# Patient Record
Sex: Male | Born: 1950 | Race: White | Hispanic: No | Marital: Single | Smoking: Never smoker
Health system: Southern US, Community
[De-identification: ages and names within clinical notes are randomized; demographics above are authoritative.]

## PROBLEM LIST (undated history)

## (undated) DIAGNOSIS — I2510 Atherosclerotic heart disease of native coronary artery without angina pectoris: Secondary | ICD-10-CM

## (undated) DIAGNOSIS — E119 Type 2 diabetes mellitus without complications: Secondary | ICD-10-CM

## (undated) DIAGNOSIS — J449 Chronic obstructive pulmonary disease, unspecified: Secondary | ICD-10-CM

## (undated) DIAGNOSIS — I4891 Unspecified atrial fibrillation: Secondary | ICD-10-CM

## (undated) HISTORY — PX: CORONARY ARTERY BYPASS GRAFT: SHX141

## (undated) HISTORY — PX: CHOLECYSTECTOMY: SHX55

---

## 2001-11-15 ENCOUNTER — Emergency Department (HOSPITAL_COMMUNITY): Admission: EM | Admit: 2001-11-15 | Discharge: 2001-11-15 | Payer: Self-pay | Admitting: *Deleted

## 2001-11-15 ENCOUNTER — Encounter: Payer: Self-pay | Admitting: *Deleted

## 2002-03-16 ENCOUNTER — Emergency Department (HOSPITAL_COMMUNITY): Admission: EM | Admit: 2002-03-16 | Discharge: 2002-03-16 | Payer: Self-pay | Admitting: Emergency Medicine

## 2002-03-16 ENCOUNTER — Encounter: Payer: Self-pay | Admitting: Emergency Medicine

## 2015-08-27 ENCOUNTER — Emergency Department (HOSPITAL_COMMUNITY)
Admission: EM | Admit: 2015-08-27 | Discharge: 2015-08-27 | Disposition: A | Discharge status: Home / Self Care | Payer: Medicare Other | Attending: Emergency Medicine | Admitting: Emergency Medicine

## 2015-08-27 ENCOUNTER — Encounter (HOSPITAL_COMMUNITY): Payer: Self-pay | Admitting: *Deleted

## 2015-08-27 ENCOUNTER — Emergency Department (HOSPITAL_COMMUNITY): Payer: Medicare Other

## 2015-08-27 DIAGNOSIS — R0789 Other chest pain: Secondary | ICD-10-CM | POA: Diagnosis not present

## 2015-08-27 DIAGNOSIS — M545 Low back pain: Secondary | ICD-10-CM | POA: Diagnosis present

## 2015-08-27 HISTORY — DX: Type 2 diabetes mellitus without complications: E11.9

## 2015-08-27 HISTORY — DX: Atherosclerotic heart disease of native coronary artery without angina pectoris: I25.10

## 2015-08-27 HISTORY — DX: Chronic obstructive pulmonary disease, unspecified: J44.9

## 2015-08-27 HISTORY — DX: Unspecified atrial fibrillation: I48.91

## 2015-08-27 MED ORDER — OXYCODONE-ACETAMINOPHEN 5-325 MG PO TABS
1.0000 | ORAL_TABLET | Freq: Once | ORAL | Status: AC
  Administered 2015-08-27: 1 via ORAL
  Filled 2015-08-27: qty 1

## 2015-08-27 MED ORDER — OXYCODONE-ACETAMINOPHEN 5-325 MG PO TABS: 1.0000 | ORAL_TABLET | ORAL | Status: DC | PRN

## 2015-08-27 NOTE — ED Notes (Signed)
Pt reporting pain in right lower back.  Reporting pain began yesterday morning.  States that he did have a CT scan about a month ago.  States that there were no stones seen at that time.

## 2015-08-27 NOTE — ED Notes (Signed)
Patient transported to X-ray 

## 2015-08-27 NOTE — ED Notes (Signed)
Pt reports mid to lower right sided back pain that radiates around to the posterior right side of abdomen that started yesterday morning. Pt denies fall or injury. Pt denies any urinary problems. Pt reports having CT scan about a month ago due to trouble with urination which came back negative for kidney stones. Pt was not having any back pain at the time of CT scan. Pt has extreme pain when trying to move even the slightest bit. Pt very tender upon palpation of the area.

## 2015-08-27 NOTE — Discharge Instructions (Signed)
Chest Wall Pain Chest wall pain is pain in or around the bones and muscles of your chest. It may take up to 6 weeks to get better. It may take longer if you must stay physically active in your work and activities.  CAUSES  Chest wall pain may happen on its own. However, it may be caused by:  A viral illness like the flu.  Injury.  Coughing.  Exercise.  Arthritis.  Fibromyalgia.  Shingles. HOME CARE INSTRUCTIONS   Avoid overtiring physical activity. Try not to strain or perform activities that cause pain. This includes any activities using your chest or your abdominal and side muscles, especially if heavy weights are used.  Put ice on the sore area.  Put ice in a plastic bag.  Place a towel between your skin and the bag.  Leave the ice on for 15-20 minutes per hour while awake for the first 2 days.  Only take over-the-counter or prescription medicines for pain, discomfort, or fever as directed by your caregiver. SEEK IMMEDIATE MEDICAL CARE IF:   Your pain increases, or you are very uncomfortable.  You have a fever.  Your chest pain becomes worse.  You have new, unexplained symptoms.  You have nausea or vomiting.  You feel sweaty or lightheaded.  You have a cough with phlegm (sputum), or you cough up blood. MAKE SURE YOU:   Understand these instructions.  Will watch your condition.  Will get help right away if you are not doing well or get worse. Document Released: 12/04/2005 Document Revised: 02/26/2012 Document Reviewed: 07/31/2011 [facility] Patient Information 2015 [place], [place]. This information is not intended to replace advice given to you by your health care provider. Make sure you discuss any questions you have with your health care provider.  Acetaminophen; Oxycodone tablets What is this medicine? ACETAMINOPHEN; OXYCODONE (a set a MEE noe fen; ox i KOE done) is a pain reliever. It is used to treat mild to moderate pain. This medicine may be used for  other purposes; ask your health care provider or pharmacist if you have questions. COMMON BRAND NAME(S): Endocet, Magnacet, Narvox, Percocet, Perloxx, Primalev, Primlev, Roxicet, Xolox What should I tell my health care provider before I take this medicine? They need to know if you have any of these conditions: -brain tumor -Crohn's disease, inflammatory bowel disease, or ulcerative colitis -drug abuse or addiction -head injury -heart or circulation problems -if you often drink alcohol -kidney disease or problems going to the bathroom -liver disease -lung disease, asthma, or breathing problems -an unusual or allergic reaction to acetaminophen, oxycodone, other opioid analgesics, other medicines, foods, dyes, or preservatives -pregnant or trying to get pregnant -breast-feeding How should I use this medicine? Take this medicine by mouth with a full glass of water. Follow the directions on the prescription label. Take your medicine at regular intervals. Do not take your medicine more often than directed. Talk to your pediatrician regarding the use of this medicine in children. Special care may be needed. Patients over 61 years old may have a stronger reaction and need a smaller dose. Overdosage: If you think you have taken too much of this medicine contact a poison control center or emergency room at once. NOTE: This medicine is only for you. Do not share this medicine with others. What if I miss a dose? If you miss a dose, take it as soon as you can. If it is almost time for your next dose, take only that dose. Do not take double  or extra doses. What may interact with this medicine? -alcohol -antihistamines -barbiturates like amobarbital, butalbital, butabarbital, methohexital, pentobarbital, phenobarbital, thiopental, and secobarbital -benztropine -drugs for bladder problems like solifenacin, trospium, oxybutynin, tolterodine, hyoscyamine, and methscopolamine -drugs for breathing problems  like ipratropium and tiotropium -drugs for certain stomach or intestine problems like propantheline, homatropine methylbromide, glycopyrrolate, atropine, belladonna, and dicyclomine -general anesthetics like etomidate, ketamine, nitrous oxide, propofol, desflurane, enflurane, halothane, isoflurane, and sevoflurane -medicines for depression, anxiety, or psychotic disturbances -medicines for sleep -muscle relaxants -naltrexone -narcotic medicines (opiates) for pain -phenothiazines like perphenazine, thioridazine, chlorpromazine, mesoridazine, fluphenazine, prochlorperazine, promazine, and trifluoperazine -scopolamine -tramadol -trihexyphenidyl This list may not describe all possible interactions. Give your health care provider a list of all the medicines, herbs, non-prescription drugs, or dietary supplements you use. Also tell them if you smoke, drink alcohol, or use illegal drugs. Some items may interact with your medicine. What should I watch for while using this medicine? Tell your doctor or health care professional if your pain does not go away, if it gets worse, or if you have new or a different type of pain. You may develop tolerance to the medicine. Tolerance means that you will need a higher dose of the medication for pain relief. Tolerance is normal and is expected if you take this medicine for a long time. Do not suddenly stop taking your medicine because you may develop a severe reaction. Your body becomes used to the medicine. This does NOT mean you are addicted. Addiction is a behavior related to getting and using a drug for a non-medical reason. If you have pain, you have a medical reason to take pain medicine. Your doctor will tell you how much medicine to take. If your doctor wants you to stop the medicine, the dose will be slowly lowered over time to avoid any side effects. You may get drowsy or dizzy. Do not drive, use machinery, or do anything that needs mental alertness until you  know how this medicine affects you. Do not stand or sit up quickly, especially if you are an older patient. This reduces the risk of dizzy or fainting spells. Alcohol may interfere with the effect of this medicine. Avoid alcoholic drinks. There are different types of narcotic medicines (opiates) for pain. If you take more than one type at the same time, you may have more side effects. Give your health care provider a list of all medicines you use. Your doctor will tell you how much medicine to take. Do not take more medicine than directed. Call emergency for help if you have problems breathing. The medicine will cause constipation. Try to have a bowel movement at least every 2 to 3 days. If you do not have a bowel movement for 3 days, call your doctor or health care professional. Do not take Tylenol (acetaminophen) or medicines that have acetaminophen with this medicine. Too much acetaminophen can be very dangerous. Many nonprescription medicines contain acetaminophen. Always read the labels carefully to avoid taking more acetaminophen. What side effects may I notice from receiving this medicine? Side effects that you should report to your doctor or health care professional as soon as possible: -allergic reactions like skin rash, itching or hives, swelling of the face, lips, or tongue -breathing difficulties, wheezing -confusion -light headedness or fainting spells -severe stomach pain -unusually weak or tired -yellowing of the skin or the whites of the eyes Side effects that usually do not require medical attention (report to your doctor or health care professional if they  continue or are bothersome): -dizziness -drowsiness -nausea -vomiting This list may not describe all possible side effects. Call your doctor for medical advice about side effects. You may report side effects to FDA at 1-800-FDA-1088. Where should I keep my medicine? Keep out of the reach of children. This medicine can be  abused. Keep your medicine in a safe place to protect it from theft. Do not share this medicine with anyone. Selling or giving away this medicine is dangerous and against the law. Store at room temperature between 20 and 25 degrees C (68 and 77 degrees F). Keep container tightly closed. Protect from light. This medicine may cause accidental overdose and death if it is taken by other adults, children, or pets. Flush any unused medicine down the toilet to reduce the chance of harm. Do not use the medicine after the expiration date. NOTE: This sheet is a summary. It may not cover all possible information. If you have questions about this medicine, talk to your doctor, pharmacist, or health care provider.  2015, [employer] Standard. (2013-07-28 13:17:35)  Adenosine Stress Electrocardiography An adenosine stress electrocardiography is a test used to detect heart disease (coronary artery disease). Adenosine is a medicine that makes the heart arteries react as if you are exercising. Adenosine is given with a radioactive tracer. The "tracer" is a safe radioactive substance that travels in the bloodstream to the heart arteries. Special imaging cameras detect the tracer and help find blocked arteries in the heart. This test may be done with or without treadmill exercise.  LET [facility] CARE PROVIDER KNOW ABOUT:  Allergies, including latex allergies.  All prescription medicines you taking as well as all non-prescription and over-the-counter medicines, including herbs and vitamins.  Use of steroids (by mouth or creams).  Previous problems with anesthetics or novocaine.  History of blood clots or bleeding problems.  Previous surgery.  Other health problems such as kidney or lung conditions.  Possibility of pregnancy, if this applies. RISKS AND COMPLICATIONS You may develop chest discomfort, shortness of breath, sweating, or light-headedness during the test. On rare occasions, you could experience  a heart attack or your heart may go into a very fast or irregular rhythm. This could cause you to collapse. To ensure your safety, your health care provider will supervise the test. Your blood pressure and electrocardiogram are constantly watched. The test team watches for and is able to treat any problems. BEFORE THE PROCEDURE  Do not eat or drink caffeine for 12 to 24 hours before the test. This includes all caffeinated beverages and food, such as pop, coffee (roasted, instant, decaffeinated roasted, decaffeinated instant), hot chocolate, tea, and all chocolate.  Do not smoke on the day of your test. Smoking on the day of your test may change your test results.  Do not eat anything 3 hours before the test or as recommended by your health care provider. Eating may cause an unclear image and may also cause nausea. If you are diabetic, talk to your health care provider regarding your insulin coverage.  Bring a list of all the medicines you are taking. Take your medicine as usual before the test except as told by the testing center.  Wear comfortable clothing, such as a short-sleeve shirt and sweatpants. Do not wear an underwire bra or jewelry. A hospital gown can be provided.  Shower before your appointment to reduce the spread of bacteria.  You may want to bring a book to read because there are some waiting periods during the  test.  Your health care provider will go over the adenosine stress test with you, such as procedure protocol, what to expect, how long it will take, and results. PROCEDURE   An IV will be started in a vein in your hand or arm.  Electrode patches will be placed on your chest. The electrodes are connected to a monitor so your heart rhythm and heart rate can be watched. Your blood pressure will also be monitored during the test.  Two sets of images are usually taken of your heart. The images compare your heart at rest and when it is "stressed." This first image is a  "resting" picture of your heart. The "resting" image is usually done before adenosine is given.  Adenosine is given in the IV over a period of 4 to 6 minutes.  After the adenosine is given, you will be monitored for a few minutes afterward to ensure your heart rate, heart rhythm, and blood pressure are normal. AFTER THE PROCEDURE  When your test is completed, you may be asked to schedule an office visit with your health care provider to discuss the test results, or your health care provider may choose to call you with the results.  Document Released: 02/11/2007 Document Revised: 04/20/2014 Document Reviewed: 03/20/2012 [facility] Patient Information 2015 [place], [place]. This information is not intended to replace advice given to you by your health care provider. Make sure you discuss any questions you have with your health care provider.

## 2015-08-27 NOTE — ED Provider Notes (Signed)
CSN: [id number]     Arrival date & time 08/27/15  0615 History   First MD Initiated Contact with Patient 08/27/15 [phone]     Chief Complaint  Patient presents with  . Back Pain     (Consider location/radiation/quality/duration/timing/severity/associated sxs/prior Treatment) Patient is a 64 y.o. male presenting with back pain. The history is provided by the patient.  Back Pain He has been having pain in his right lower back since yesterday. Pain is sharp and worse with movement. It feels better if he lays still. He denies any weakness or numbness or tingling and denies any bowel or bladder dysfunction. He denies any recent trauma or unusual activity. He took acetaminophen for pain without relief. He denies previous back problems.  No past medical history on file. No past surgical history on file. No family history on file. Social History  Substance Use Topics  . Smoking status: Not on file  . Smokeless tobacco: Not on file  . Alcohol Use: Not on file    Review of Systems  Musculoskeletal: Positive for back pain.  All other systems reviewed and are negative.     Allergies  Review of patient's allergies indicates not on file.  Home Medications   Prior to Admission medications   Not on File   BP 99/72 mmHg  Pulse 73  Temp(Src) 97.5 F (36.4 C) (Oral)  Resp 16  Ht 6\' 4"  (1.93 m)  Wt 245 lb (111.131 kg)  BMI 29.83 kg/m2  SpO2 95% Physical Exam  Nursing note and vitals reviewed.  64 year old male, resting comfortably and in no acute distress. Vital signs are normal. Oxygen saturation is 95%, which is normal. Head is normocephalic and atraumatic. PERRLA, EOMI. Oropharynx is clear. Neck is nontender and supple without adenopathy or JVD. Lungs are clear without rales, wheezes, or rhonchi. Heart has regular rate and rhythm without murmur. Chest has point tenderness in the right lower posterior rib cage. Back is nontender and there is no CVA tenderness. Tenderness is only  over the ribs. Abdomen is soft, flat, nontender without masses or hepatosplenomegaly and peristalsis is normoactive. Extremities have no cyanosis or edema, full range of motion is present. Skin is warm and dry without rash. Neurologic: Mental status is normal, cranial nerves are intact, there are no motor or sensory deficits.  ED Course  Procedures (including critical care time)  Imaging Review Dg Ribs Unilateral W/chest Right  08/27/2015   CLINICAL DATA:  Right lower chest and back pain which began 08/26/2015. No known injury. Initial encounter.  EXAM: RIGHT RIBS AND CHEST - 3+ VIEW  COMPARISON:  None.  FINDINGS: Pacing device is in place. There is cardiomegaly and mild pulmonary vascular congestion. No edema, consolidative process, pneumothorax or effusion is identified. No fracture is seen.  IMPRESSION: Negative for fracture or other acute abnormality.  Cardiomegaly and pulmonary vascular congestion.   Electronically Signed   By: Drusilla Kanner M.D.   On: 08/27/2015 07:42   I have personally reviewed and evaluated these images as part of my medical decision-making.   MDM   Final diagnoses:  Chest wall pain    Back pain which seems to be centered over the right ribs. He will be sent for x-rays of the right ribs.  X-rays are unremarkable. He is discharged with prescription for oxycodone have acetaminophen for pain.  Dione Booze, MD 08/27/15 [phone]

## 2015-10-09 ENCOUNTER — Other Ambulatory Visit: Payer: Self-pay

## 2015-10-09 ENCOUNTER — Emergency Department (HOSPITAL_COMMUNITY): Payer: Medicare Other

## 2015-10-09 ENCOUNTER — Inpatient Hospital Stay (HOSPITAL_COMMUNITY)
Admission: EM | Admit: 2015-10-09 | Discharge: 2015-10-12 | DRG: 392 | Disposition: A | Discharge status: Home / Self Care | Payer: Medicare Other | Attending: Internal Medicine | Admitting: Internal Medicine

## 2015-10-09 DIAGNOSIS — E119 Type 2 diabetes mellitus without complications: Secondary | ICD-10-CM

## 2015-10-09 DIAGNOSIS — Z7901 Long term (current) use of anticoagulants: Secondary | ICD-10-CM | POA: Diagnosis not present

## 2015-10-09 DIAGNOSIS — R791 Abnormal coagulation profile: Secondary | ICD-10-CM | POA: Diagnosis present

## 2015-10-09 DIAGNOSIS — I5022 Chronic systolic (congestive) heart failure: Secondary | ICD-10-CM | POA: Diagnosis present

## 2015-10-09 DIAGNOSIS — E860 Dehydration: Secondary | ICD-10-CM | POA: Diagnosis present

## 2015-10-09 DIAGNOSIS — Z6830 Body mass index (BMI) 30.0-30.9, adult: Secondary | ICD-10-CM

## 2015-10-09 DIAGNOSIS — R1030 Lower abdominal pain, unspecified: Secondary | ICD-10-CM

## 2015-10-09 DIAGNOSIS — I2510 Atherosclerotic heart disease of native coronary artery without angina pectoris: Secondary | ICD-10-CM | POA: Diagnosis present

## 2015-10-09 DIAGNOSIS — I482 Chronic atrial fibrillation, unspecified: Secondary | ICD-10-CM | POA: Diagnosis present

## 2015-10-09 DIAGNOSIS — Z950 Presence of cardiac pacemaker: Secondary | ICD-10-CM

## 2015-10-09 DIAGNOSIS — I959 Hypotension, unspecified: Secondary | ICD-10-CM | POA: Diagnosis present

## 2015-10-09 DIAGNOSIS — Z951 Presence of aortocoronary bypass graft: Secondary | ICD-10-CM

## 2015-10-09 DIAGNOSIS — D696 Thrombocytopenia, unspecified: Secondary | ICD-10-CM | POA: Diagnosis present

## 2015-10-09 DIAGNOSIS — K5732 Diverticulitis of large intestine without perforation or abscess without bleeding: Secondary | ICD-10-CM | POA: Diagnosis not present

## 2015-10-09 DIAGNOSIS — E669 Obesity, unspecified: Secondary | ICD-10-CM | POA: Diagnosis present

## 2015-10-09 DIAGNOSIS — D649 Anemia, unspecified: Secondary | ICD-10-CM | POA: Diagnosis present

## 2015-10-09 DIAGNOSIS — J449 Chronic obstructive pulmonary disease, unspecified: Secondary | ICD-10-CM | POA: Diagnosis present

## 2015-10-09 DIAGNOSIS — Z794 Long term (current) use of insulin: Secondary | ICD-10-CM

## 2015-10-09 DIAGNOSIS — R109 Unspecified abdominal pain: Secondary | ICD-10-CM | POA: Diagnosis not present

## 2015-10-09 DIAGNOSIS — N179 Acute kidney failure, unspecified: Secondary | ICD-10-CM | POA: Diagnosis present

## 2015-10-09 DIAGNOSIS — K5792 Diverticulitis of intestine, part unspecified, without perforation or abscess without bleeding: Secondary | ICD-10-CM | POA: Diagnosis present

## 2015-10-09 LAB — CBC WITH DIFFERENTIAL/PLATELET
BASOS PCT: 0 %
Basophils Absolute: 0 10*3/uL (ref 0.0–0.1)
EOS ABS: 0.1 10*3/uL (ref 0.0–0.7)
Eosinophils Relative: 1 %
HCT: 35.9 % — ABNORMAL LOW (ref 39.0–52.0)
HEMOGLOBIN: 12.2 g/dL — AB (ref 13.0–17.0)
Lymphocytes Relative: 14 %
Lymphs Abs: 1.9 10*3/uL (ref 0.7–4.0)
MCH: 32 pg (ref 26.0–34.0)
MCHC: 34 g/dL (ref 30.0–36.0)
MCV: 94.2 fL (ref 78.0–100.0)
Monocytes Absolute: 2.6 10*3/uL — ABNORMAL HIGH (ref 0.1–1.0)
Monocytes Relative: 19 %
NEUTROS PCT: 65 %
Neutro Abs: 8.8 10*3/uL — ABNORMAL HIGH (ref 1.7–7.7)
PLATELETS: 129 10*3/uL — AB (ref 150–400)
RBC: 3.81 MIL/uL — AB (ref 4.22–5.81)
RDW: 16.1 % — ABNORMAL HIGH (ref 11.5–15.5)
WBC: 13.4 10*3/uL — AB (ref 4.0–10.5)

## 2015-10-09 LAB — COMPREHENSIVE METABOLIC PANEL
ALBUMIN: 4.1 g/dL (ref 3.5–5.0)
ALK PHOS: 56 U/L (ref 38–126)
ALT: 14 U/L — AB (ref 17–63)
ANION GAP: 12 (ref 5–15)
AST: 21 U/L (ref 15–41)
BUN: 93 mg/dL — ABNORMAL HIGH (ref 6–20)
CALCIUM: 9 mg/dL (ref 8.9–10.3)
CHLORIDE: 97 mmol/L — AB (ref 101–111)
CO2: 25 mmol/L (ref 22–32)
Creatinine, Ser: 2.68 mg/dL — ABNORMAL HIGH (ref 0.61–1.24)
GFR calc Af Amer: 27 mL/min — ABNORMAL LOW (ref >60)
GFR calc non Af Amer: 24 mL/min — ABNORMAL LOW (ref >60)
GLUCOSE: 139 mg/dL — AB (ref 65–99)
Potassium: 4.2 mmol/L (ref 3.5–5.1)
SODIUM: 134 mmol/L — AB (ref 135–145)
Total Bilirubin: 1.4 mg/dL — ABNORMAL HIGH (ref 0.3–1.2)
Total Protein: 7.8 g/dL (ref 6.5–8.1)

## 2015-10-09 LAB — LACTIC ACID, PLASMA: LACTIC ACID, VENOUS: 1 mmol/L (ref 0.5–2.0)

## 2015-10-09 LAB — URINALYSIS, ROUTINE W REFLEX MICROSCOPIC
BILIRUBIN URINE: NEGATIVE
Glucose, UA: NEGATIVE mg/dL
Hgb urine dipstick: NEGATIVE
Ketones, ur: NEGATIVE mg/dL
Leukocytes, UA: NEGATIVE
NITRITE: NEGATIVE
PH: 5.5 (ref 5.0–8.0)
Protein, ur: NEGATIVE mg/dL
SPECIFIC GRAVITY, URINE: 1.015 (ref 1.005–1.030)
Urobilinogen, UA: 0.2 mg/dL (ref 0.0–1.0)

## 2015-10-09 LAB — GLUCOSE, CAPILLARY: GLUCOSE-CAPILLARY: 163 mg/dL — AB (ref 65–99)

## 2015-10-09 LAB — POC OCCULT BLOOD, ED: FECAL OCCULT BLD: NEGATIVE

## 2015-10-09 LAB — PROTIME-INR
INR: 4.62 — ABNORMAL HIGH (ref 0.00–1.49)
Prothrombin Time: 42.3 seconds — ABNORMAL HIGH (ref 11.6–15.2)

## 2015-10-09 MED ORDER — METRONIDAZOLE IN NACL 5-0.79 MG/ML-% IV SOLN
500.0000 mg | Freq: Three times a day (TID) | INTRAVENOUS | Status: DC
  Administered 2015-10-10 – 2015-10-11 (×6): 500 mg via INTRAVENOUS
  Filled 2015-10-09 (×6): qty 100

## 2015-10-09 MED ORDER — CIPROFLOXACIN HCL 500 MG PO TABS: 500.0000 mg | ORAL_TABLET | Freq: Two times a day (BID) | ORAL | Status: DC

## 2015-10-09 MED ORDER — MEXILETINE HCL 150 MG PO CAPS
150.0000 mg | ORAL_CAPSULE | Freq: Three times a day (TID) | ORAL | Status: DC
  Administered 2015-10-09 – 2015-10-12 (×7): 150 mg via ORAL
  Filled 2015-10-09 (×13): qty 1

## 2015-10-09 MED ORDER — SIMVASTATIN 20 MG PO TABS
20.0000 mg | ORAL_TABLET | Freq: Every day | ORAL | Status: DC
  Administered 2015-10-10 – 2015-10-12 (×3): 20 mg via ORAL
  Filled 2015-10-09 (×3): qty 1

## 2015-10-09 MED ORDER — WARFARIN - PHARMACIST DOSING INPATIENT: Status: DC

## 2015-10-09 MED ORDER — CIPROFLOXACIN IN D5W 400 MG/200ML IV SOLN
400.0000 mg | Freq: Once | INTRAVENOUS | Status: AC
  Administered 2015-10-09: 400 mg via INTRAVENOUS
  Filled 2015-10-09: qty 200

## 2015-10-09 MED ORDER — SODIUM CHLORIDE 0.9 % IV SOLN
INTRAVENOUS | Status: DC
  Administered 2015-10-09: 21:00:00 via INTRAVENOUS

## 2015-10-09 MED ORDER — INSULIN ASPART PROT & ASPART (70-30 MIX) 100 UNIT/ML ~~LOC~~ SUSP
SUBCUTANEOUS | Status: AC
Start: 2015-10-09 — End: 2015-10-09
  Filled 2015-10-09: qty 10

## 2015-10-09 MED ORDER — SODIUM CHLORIDE 0.9 % IV BOLUS (SEPSIS)
500.0000 mL | Freq: Once | INTRAVENOUS | Status: AC
  Administered 2015-10-09: 500 mL via INTRAVENOUS

## 2015-10-09 MED ORDER — METRONIDAZOLE 500 MG PO TABS
500.0000 mg | ORAL_TABLET | Freq: Once | ORAL | Status: AC
  Administered 2015-10-09: 500 mg via ORAL
  Filled 2015-10-09: qty 1

## 2015-10-09 MED ORDER — ONDANSETRON HCL 4 MG/2ML IJ SOLN: 4.0000 mg | Freq: Four times a day (QID) | INTRAMUSCULAR | Status: DC | PRN

## 2015-10-09 MED ORDER — MEXILETINE HCL 150 MG PO CAPS
ORAL_CAPSULE | ORAL | Status: AC
  Filled 2015-10-09: qty 1

## 2015-10-09 MED ORDER — CIPROFLOXACIN IN D5W 400 MG/200ML IV SOLN
400.0000 mg | Freq: Two times a day (BID) | INTRAVENOUS | Status: DC
  Administered 2015-10-10 – 2015-10-12 (×5): 400 mg via INTRAVENOUS
  Filled 2015-10-09 (×5): qty 200

## 2015-10-09 MED ORDER — MORPHINE SULFATE (PF) 4 MG/ML IV SOLN
4.0000 mg | Freq: Once | INTRAVENOUS | Status: DC | PRN
Start: 2015-10-09 — End: 2015-10-09
  Administered 2015-10-09: 4 mg via INTRAVENOUS
  Filled 2015-10-09: qty 1

## 2015-10-09 MED ORDER — ONDANSETRON HCL 4 MG PO TABS: 4.0000 mg | ORAL_TABLET | Freq: Four times a day (QID) | ORAL | Status: DC | PRN

## 2015-10-09 MED ORDER — ACETAMINOPHEN 650 MG RE SUPP: 650.0000 mg | Freq: Four times a day (QID) | RECTAL | Status: DC | PRN

## 2015-10-09 MED ORDER — ONDANSETRON HCL 4 MG/2ML IJ SOLN: 4.0000 mg | Freq: Three times a day (TID) | INTRAMUSCULAR | Status: DC | PRN

## 2015-10-09 MED ORDER — INSULIN ASPART PROT & ASPART (70-30 MIX) 100 UNIT/ML ~~LOC~~ SUSP
20.0000 [IU] | Freq: Two times a day (BID) | SUBCUTANEOUS | Status: DC
  Administered 2015-10-09: 20 [IU] via SUBCUTANEOUS
  Filled 2015-10-09: qty 10

## 2015-10-09 MED ORDER — METRONIDAZOLE 500 MG PO TABS: 500.0000 mg | ORAL_TABLET | Freq: Three times a day (TID) | ORAL | Status: DC

## 2015-10-09 MED ORDER — TIOTROPIUM BROMIDE MONOHYDRATE 18 MCG IN CAPS
18.0000 ug | ORAL_CAPSULE | Freq: Every day | RESPIRATORY_TRACT | Status: DC
  Administered 2015-10-11 – 2015-10-12 (×2): 18 ug via RESPIRATORY_TRACT
  Filled 2015-10-09: qty 5

## 2015-10-09 MED ORDER — HYDROMORPHONE HCL 1 MG/ML IJ SOLN: 0.5000 mg | INTRAMUSCULAR | Status: DC | PRN

## 2015-10-09 MED ORDER — INSULIN LISPRO PROT & LISPRO (75-25 MIX) 100 UNIT/ML KWIKPEN: 20.0000 [IU] | PEN_INJECTOR | Freq: Two times a day (BID) | SUBCUTANEOUS | Status: DC

## 2015-10-09 MED ORDER — ACETAMINOPHEN 325 MG PO TABS: 650.0000 mg | ORAL_TABLET | Freq: Four times a day (QID) | ORAL | Status: DC | PRN

## 2015-10-09 MED ORDER — INSULIN ASPART 100 UNIT/ML ~~LOC~~ SOLN
0.0000 [IU] | Freq: Three times a day (TID) | SUBCUTANEOUS | Status: DC
  Administered 2015-10-10: 1 [IU] via SUBCUTANEOUS
  Administered 2015-10-10 – 2015-10-11 (×2): 3 [IU] via SUBCUTANEOUS
  Administered 2015-10-11 – 2015-10-12 (×3): 2 [IU] via SUBCUTANEOUS

## 2015-10-09 NOTE — ED Notes (Signed)
Patient stood up to ambulate and was very unsteady on his feet. Remained in room with patient. Orthostatic vital signs obtained. Harolyn Rutherford, PA-C and Dr. Hyacinth Meeker aware. Patient status changed to admission.

## 2015-10-09 NOTE — Discharge Instructions (Signed)
You have been diagnosed with diverticulitis today. You are being discharged with two antibiotics. Take both in their entirety even if you start to feel better. These antibiotics can increase your INR when taken with your coumadin. Be sure to follow up with a PCP or this department to have your INR checked in 48 hours.  You also need to be aware that you will need to be more careful, as your bleeding risk can increase. Your CT scan also showed small inguinal hernias on both sides.  You just need to be aware of these and follow up with a PCP on them.    Emergency Department Resource Guide 1) Find a Doctor and Pay Out of Pocket Although you won't have to find out who is covered by your insurance plan, it is a good idea to ask around and get recommendations. You will then need to call the office and see if the doctor you have chosen will accept you as a new patient and what types of options they offer for patients who are self-pay. Some doctors offer discounts or will set up payment plans for their patients who do not have insurance, but you will need to ask so you aren't surprised when you get to your appointment.  2) Contact Your Local Health Department Not all health departments have doctors that can see patients for sick visits, but many do, so it is worth a call to see if yours does. If you don't know where your local health department is, you can check in your phone book. The CDC also has a tool to help you locate your state's health department, and many state websites also have listings of all of their local health departments.  3) Find a Walk-in Clinic If your illness is not likely to be very severe or complicated, you may want to try a walk in clinic. These are popping up all over the country in pharmacies, drugstores, and shopping centers. They're usually staffed by nurse practitioners or physician assistants that have been trained to treat common illnesses and complaints. They're usually fairly  quick and inexpensive. However, if you have serious medical issues or chronic medical problems, these are probably not your best option.  No Primary Care Doctor: - Call Health Connect at  [phone] - they can help you locate a primary care doctor that  accepts your insurance, provides certain services, etc. - Physician Referral Service- [phone]  Chronic Pain Problems: Organization         Address  Phone   Notes  [facility]  [phone] Patients need to be referred by their primary care doctor.   Medication Assistance: Organization         Address  Phone   Notes  [facility] Medication [facility] [address] [place], [place] [address] [phone] --Must be a resident of [facility] -- Must have NO insurance coverage whatsoever (no Medicaid/ Medicare, etc.) -- The pt. MUST have a primary care doctor that directs their care regularly and follows them in the community   [facility]  [phone]   [employer]  [phone]    Agencies that provide inexpensive medical care: Organization         Address  Phone   Notes  [facility]  [phone]   [facility]    [phone]   [facility] [address] [place], [place] [address] [phone]  [facility] [address], [place] [phone]   Planned Parenthood    [phone]   [facility]    [phone]   [facility]  [address], [place] Phone:  [phone], Fax:  [phone] Hours of Operation:  9 am - 6 pm, M-F.  Also accepts Medicaid/Medicare and self-pay.  [facility]  [address], [place] Phone: [phone], Fax: [phone]. Hours of Operation:  8:30 am - 5:30 pm, M-F.  Also accepts Medicaid and self-pay.  [facility] [place] [address], [place] Phone: [phone]    [facility] [address] Natasha Bence [place], [place] [phone], Ext. [phone] Mondays & Thursdays: 7-9 AM.  First 15 patients are seen on a first come, first serve basis.    Medicaid-accepting [facility] Providers:  Organization         Address  Phone   Notes  [facility] [address], [place] [phone] Also accepts self-pay patients.  [facility] [address] Laurell Josephs [place], [place]  [phone]   [facility] [address], [place] [phone]   [facility] [address], [place] [phone]   Renaye Rakers [address], [place]   [phone] Only accepts [place] Access [place] patients after they have their name applied to their card.   Self-Pay (no insurance) in [facility]:  Organization         Address  Phone   Notes  Sickle Cell Patients, [facility] [address] [place], [place] [phone]   [facility] [address] [place], [place] [phone]   [facility] [place]  [address], [place] [phone]   [facility]/Dr. Osei-Bonsu  [address], [place] or [address], [place] [phone] Phone number for both [place] and [place] locations is the same.  [facility] [address] [place] [phone]   [facility] [address] [place] or [address] [phone]   [facility] [address] [place] [phone], phone; [phone], fax Sees patients 1st and 3rd Saturday of every month.  Must not qualify for public or private insurance (i.e. Medicaid, Medicare, [place] Health Choice, Veterans' Benefits)  Household income should be no more than 200% of the poverty level The clinic cannot treat you if you are  pregnant or think you are pregnant  Sexually transmitted diseases are not treated at the clinic.    Dental Care: Organization         Address  Phone  Notes  [facility] [address] [place], [place] [phone] Accepts children up to age 103 who are enrolled in [place] or [place] Health Choice; pregnant women with a Medicaid card; and children who have applied for Medicaid or [place] Health Choice, but were declined, whose parents can pay a reduced fee at time of service.  [facility]  [address], [place] [phone] Accepts children up to age 84 who are enrolled in [place] or  Health Choice; pregnant women with a Medicaid card; and  children who have applied for Medicaid or [place] Health Choice, but were declined, whose parents can pay a reduced fee at time of service.  [facility]  [address], [place] [phone] Patients are seen by appointment only. Walk-ins are not accepted. [facility] will see patients 65 years of age and older. Monday - Tuesday (8am-5pm) Most Wednesdays (8:30-5pm) $30 per visit, cash only  [facility]  [address], [facility] [phone] Patients are seen by appointment only. Walk-ins are not accepted. [facility] will see patients 65 years of age and older. One Wednesday Evening (Monthly: Volunteer Based).  $30 per visit, cash only  [employer] [facility]  [phone] for adults; Children under age 744, call Graduate Pediatric Dentistry at [phone]. Children aged 544-14, please call [phone] to request a pediatric application.  Dental services are provided in all areas of dental care including fillings, crowns and bridges, complete and partial dentures, implants, gum treatment, root canals, and extractions. Preventive care is also provided. Treatment is provided to  both adults and children. Patients are selected via a lottery and there is often a waiting list.   [facility] [address], [place]  [phone]) [phone] [email or web address]   [facility] [address], [place], [place] [phone], Ext. [phone] Second and Fourth Thursday of each month, opens at 6:30 AM; Clinic ends at 9 AM.  Patients are seen on a first-come first-served basis, and a limited number are seen during each clinic.   [facility]  [address], [place], [place] [phone]   Eligibility Requirements You must have lived in [place], [place], or [place] counties for at least the last three months.   You cannot be eligible for state or federal sponsored [place]healthcare insurance, including CIGNAVeterans Administration, [place]icaid, or [employer]icare.   You generally cannot be eligible for healthcare insurance through your employer.    How to apply: Eligibility screenings are held every Tuesday and Wednesday afternoon from 1:00 pm until 4:00 pm. You do not need an appointment for the interview!  [facility] [address], [place], [place] [phone]   [facility]  [phone]   [facility]  [phone]   [facility]  [phone]    Behavioral Health Resources in the Community: Intensive Outpatient Programs Organization         Address  Phone  Notes  [facility] [address], [place], [place] [phone]   [facility] [address], [place], [place] [phone]   [facility] [address], [place], [place]  [phone]   [facility] [address],  [place], [place] [phone] or [phone]   Substance Abuse Resources Organization         Address  Phone  Notes  [facility]  [phone]   [facility]  [phone]   [facility]   [phone]   [facility]  [phone]   Residential & Outpatient Substance Abuse Program  [phone]   Psychological Services Organization         Address  Phone  Notes  [facility]  [phone]- [phone]   [facility]  [phone]   [facility] [address], [place] [phone] or [phone]    Mobile Crisis Teams Organization  Address  Phone  Notes  [facility], Mobile Crisis Care Unit  [phone]   [facility]  [address] [place], [place] [phone]   [facility] [address] [place] [phone]    Self-Help/Support Groups Organization         Address  Phone             Notes  [facility] [place] - variety of support groups  [phone]- [phone] Call for more information  Narcotics Anonymous (NA), [facility] [address], [employer] [place]  2 meetings at this location   Statistician         Address  Phone  Notes  [facility] [address],    [place]  [phone]   [facility]  [address], [place] [address], [place], [place] [phone]   [facility] [address] [place], [place] [phone] Admissions: 8am-3pm M-F  [facility] [address]    [place], [place] [phone]   [facility] [address] [place], [place], [place] [phone]   [facility] [address],  [place], [place] [phone]   [facility] - Intensive Outpatient [address]ure[address], [place], [place] [phone]   [facility] ([facility] [address] [place]  [place], [place] [phone] or [phone]   [facility] ([facility]) [address], [place], [place] [phone] Accepts Medicaid  [facility] [address],  [place] [phone] Substance Abuse/Addiction Treatment   [facility] Organization         Address  Phone  Notes  [facility]  [phone]   Angie Fava, PhD [address] Ervin Kn[place], [place]   [phone] or [phone]   [facility] Behavioral   [address] [place], [place] [phone]   [facility] [address] [place], [place] [phone] Insurance/Medicaid/sponsorship through [facility] and Families [address]                                    [place], [place] [phone] Therapy/tele-psych/case  [facility] [address], [place] [phone]    Dr. Lolly Mustache  [phone]   [facility]  [employer] [facility] Dept. 1) [address] [place] 2) [address] [place] 3)  [address], [place] [phone]  [phone]   [facility] Child Abuse Hotline 979-101-2789 or [phone] (After Hours)

## 2015-10-09 NOTE — ED Notes (Signed)
Lower abdominal pain x2 days.

## 2015-10-09 NOTE — ED Notes (Signed)
Informed Dr. Hyacinth Meeker about patient's blood pressure of 88/68. Order for 500 ml NS bolus given, along with ambulate patient after Cipro is finished.

## 2015-10-09 NOTE — H&P (Signed)
PCP:   No primary care provider on file.   Chief Complaint:  Abdominal pain  HPI:  64 year old male who  has a past medical history of COPD (chronic obstructive pulmonary disease); Coronary artery disease; A-fib; and Diabetes mellitus without complication. Today presents to the hospital with complaint of abdominal pain for past 2 days. Patient says that pain is located in the lower abdomen and was associated with diarrhea. He had 5 episodes of loose bowel movements yesterday and 3 episodes this morning. He denies nausea and vomiting along with it. Denies chest pain or shortness of breath. Patient has significant history of CAD status post CABG and pacemaker in place. In the ED he was found to be hypotensive but patient says that his blood pressure usually runs in low 100s has he is on torsemide, Coreg. His cardiologist is in [place]. In the ED CT scan of the abdomen showed sigmoid diverticulitis. Patient started on ciprofloxacin and Flagyl.  Allergies:  No Known Allergies    Past Medical History  Diagnosis Date  . COPD (chronic obstructive pulmonary disease)   . Coronary artery disease   . A-fib   . Diabetes mellitus without complication     Past Surgical History  Procedure Laterality Date  . Coronary artery bypass graft    . Cholecystectomy      Prior to Admission medications   Medication Sig Start Date End Date Taking? Authorizing Provider  allopurinol (ZYLOPRIM) 100 MG tablet Take 100 mg by mouth daily.   Yes Historical Provider, MD  carvedilol (COREG) 12.5 MG tablet Take 12.5 mg by mouth daily.    Yes Historical Provider, MD  Fluticasone Furoate-Vilanterol (BREO ELLIPTA) 100-25 MCG/INH AEPB Inhale 1 puff into the lungs daily.    Yes Historical Provider, MD  glyBURIDE (DIABETA) 5 MG tablet Take 5 mg by mouth daily with breakfast.   Yes Historical Provider, MD  HUMALOG MIX 75/25 KWIKPEN (75-25) 100 UNIT/ML Kwikpen Inject 20-30 Units into the skin 2 (two) times daily. 30 units  in the morning and 20 units at night. 09/29/15  Yes Historical Provider, MD  HYDROcodone-acetaminophen (NORCO) 7.5-325 MG tablet Take 1 tablet by mouth every 6 (six) hours as needed for moderate pain.   Yes Historical Provider, MD  linagliptin (TRADJENTA) 5 MG TABS tablet Take 5 mg by mouth daily.   Yes Historical Provider, MD  lisinopril (PRINIVIL,ZESTRIL) 2.5 MG tablet Take 2.5 mg by mouth daily.   Yes Historical Provider, MD  mexiletine (MEXITIL) 150 MG capsule Take 150 mg by mouth 3 (three) times daily.   Yes Historical Provider, MD  Omega-3 Fatty Acids (FISH OIL) 1000 MG CAPS Take 1 capsule by mouth daily.    Yes Historical Provider, MD  potassium chloride SA (K-DUR,KLOR-CON) 20 MEQ tablet Take 20 mEq by mouth 2 (two) times daily.   Yes Historical Provider, MD  simvastatin (ZOCOR) 20 MG tablet Take 20 mg by mouth daily.   Yes Historical Provider, MD  tamsulosin (FLOMAX) 0.4 MG CAPS capsule Take 0.4 mg by mouth daily after supper.    Yes Historical Provider, MD  tiotropium (SPIRIVA) 18 MCG inhalation capsule Place 18 mcg into inhaler and inhale daily.   Yes Historical Provider, MD  torsemide (DEMADEX) 20 MG tablet Take 20 mg by mouth daily.   Yes Historical Provider, MD  vitamin B-12 (CYANOCOBALAMIN) 100 MCG tablet Take 100 mcg by mouth daily.   Yes Historical Provider, MD  warfarin (COUMADIN) 10 MG tablet Take 10 mg by mouth daily.  Yes Historical Provider, MD  ciprofloxacin (CIPRO) 500 MG tablet Take 1 tablet (500 mg total) by mouth 2 (two) times daily. 10/09/15   Shawn C Joy, PA-C  metroNIDAZOLE (FLAGYL) 500 MG tablet Take 1 tablet (500 mg total) by mouth 3 (three) times daily. 10/09/15   Anselm Pancoast, PA-C    Social History:  reports that he has never smoked. He does not have any smokeless tobacco history on file. He reports that he does not drink alcohol or use illicit drugs.    Filed Weights   10/09/15 1358  Weight: 113.399 kg (250 lb)    All the positives are listed in  BOLD  Review of Systems:  HEENT: Headache, blurred vision, runny nose, sore throat Neck: Hypothyroidism, hyperthyroidism,,lymphadenopathy Chest : Shortness of breath, history of COPD, Asthma Heart : Chest pain, history of coronary arterey disease GI:  Nausea, vomiting, diarrhea, constipation, GERD GU: Dysuria, urgency, frequency of urination, hematuria Neuro: Stroke, seizures, syncope Psych: Depression, anxiety, hallucinations   Physical Exam: Blood pressure 81/54, pulse 73, temperature 97.6 F (36.4 C), temperature source Oral, resp. rate 20, height  (1.93 m), weight 113.399 kg (250 lb), SpO2 94 %. Constitutional:   Patient is a well-developed and well-nourished male* in no acute distress and cooperative with exam. Head: Normocephalic and atraumatic Mouth: Mucus membranes moist Eyes: PERRL, EOMI, conjunctivae normal Neck: Supple, No Thyromegaly Cardiovascular: RRR, S1 normal, S2 normal Pulmonary/Chest: CTAB, no wheezes, rales, or rhonchi Abdominal: Soft. Tenderness to palpation in eft lower quadrant  non-distended, bowel sounds are normal, no masses, organomegaly, or guarding present.  Neurological: A&O x3, Strength is normal and symmetric bilaterally, cranial nerve II-XII are grossly intact, no focal motor deficit, sensory intact to light touch bilaterally.  Extremities : No Cyanosis, Clubbing or Edema  Labs on Admission:  Basic Metabolic Panel:  Recent Labs Lab 10/09/15 1435  NA 134*  K 4.2  CL 97*  CO2 25  GLUCOSE 139*  BUN 93*  CREATININE 2.68*  CALCIUM 9.0   Liver Function Tests:  Recent Labs Lab 10/09/15 1435  AST 21  ALT 14*  ALKPHOS 56  BILITOT 1.4*  PROT 7.8  ALBUMIN 4.1   No results for input(s): LIPASE, AMYLASE in the last 168 hours. No results for input(s): AMMONIA in the last 168 hours. CBC:  Recent Labs Lab 10/09/15 1435  WBC 13.4*  NEUTROABS 8.8*  HGB 12.2*  HCT 35.9*  MCV 94.2  PLT 129*    Radiological Exams on Admission: Ct  Abdomen Pelvis Wo Contrast  10/09/2015  CLINICAL DATA:  Lower abdominal pain for 2 days, COPD, coronary artery disease, diabetes mellitus EXAM: CT ABDOMEN AND PELVIS WITHOUT CONTRAST TECHNIQUE: Multidetector CT imaging of the abdomen and pelvis was performed following the standard protocol without IV contrast. Sagittal and coronal MPR images reconstructed from axial data set. Patient drank dilute oral contrast for exam. IV contrast not utilized due to renal dysfunction, creatinine 2.86. COMPARISON:  None FINDINGS: Lung bases clear. Post median sternotomy with scattered atherosclerotic calcifications. Pacemaker leads RIGHT atrium, RIGHT ventricle and coronary sinus. Lung bases clear. Gallbladder surgically absent. 1.5 x 1.3 cm diameter exophytic nodule posterior LEFT kidney, low-attenuation consistent with cyst. Liver, spleen, pancreas, kidneys, and adrenal glands otherwise normal. Scattered atherosclerotic calcifications without aortic aneurysm. Normal appendix. Diverticulosis of sigmoid colon with sigmoid wall thickening and pericolic inflammatory changes of the sigmoid mesocolon consistent with acute diverticulitis. No evidence of abscess or free intraperitoneal air. Stomach and remaining bowel loops unremarkable. No mass,  adenopathy, or free fluid. Probable small BILATERAL inguinal hernias containing fat. Bladder, ureters and prostate gland grossly unremarkable. No acute osseous findings. IMPRESSION: Sigmoid diverticulitis. Small LEFT renal cyst. Tiny umbilical and probable small BILATERAL inguinal hernias containing fat. Electronically Signed   By: Ulyses Southward M.D.   On: 10/09/2015 18:08       Assessment/Plan Active Problems:   Sigmoid diverticulitis   Diverticulitis   CHF (congestive heart failure) (HCC)   CAD (coronary artery disease)   Chronic anticoagulation  Sigmoid diverticulitis We'll start the patient is a prone Flagyl. Clear liquid diet. IV fluids  History of atrial fibrillation Will  hold Coreg at this time due to low blood pressure. Coumadin per pharmacy consultation  C KD stage III Baseline is unknown at this time. Hold diuretics Follow BMP in a.m.  History of CAD status post CABG Stable, hold Coreg at this time due to hypotension  Hypotension Patient says that's his blood pressure usually runs low in 100s Hold torsemide, Coreg, Zestril Continue IV fluids. Monitor closely in cerebral unit.  Diabetes mellitus Continue Humalog 75/25, 20 units twice a day Sliding-scale insulin with NovoLog  DVT prophylaxis Patient on Coumadin  Code status: Full code  Family discussion: No family at bedside   Time Spent on Admission: 60 min  Alejos Reinhardt S [facility] Pager: [phone] 10/09/2015, 9:15 PM  If 7PM-7AM, please contact night-coverage  [email or web address]  Password [email or web address]

## 2015-10-09 NOTE — ED Provider Notes (Signed)
CSN: [id number]     Arrival date & time 10/09/15  1347 History   First MD Initiated Contact with Patient 10/09/15 1407     Chief Complaint  Patient presents with  . Abdominal Pain     (Consider location/radiation/quality/duration/timing/severity/associated sxs/prior Treatment) Patient is a 64 y.o. male presenting with abdominal pain.  Abdominal Pain Associated symptoms: no chest pain, no chills, no constipation, no cough, no diarrhea, no dysuria, no fever, no hematuria, no nausea, no shortness of breath and no vomiting    Stanley Castillo is a 64 y.o. male, pt with history of IDDM, diverticulitis and CABG, presents with lower abdominal pain LLQ and RLQ, sharp, 6/10, starting early Friday morning, no N/V/C/D, fever/chills, hematochezia or other accompanying complaints. Pt adds that he may have eaten some "questionable fish" because it started about 7 hours after he ate salmon patties for dinner.  Pt adds, "when I stand up it feels like something falls into my sack," gestering to his scrotum. Pt denies pain radiating into his scrotum, changes in its appearance, penile discharge, or other pain or complaints.   Past Medical History  Diagnosis Date  . COPD (chronic obstructive pulmonary disease)   . Coronary artery disease   . A-fib   . Diabetes mellitus without complication    Past Surgical History  Procedure Laterality Date  . Coronary artery bypass graft    . Cholecystectomy     No family history on file. Social History  Substance Use Topics  . Smoking status: Never Smoker   . Smokeless tobacco: Not on file  . Alcohol Use: No    Review of Systems  Constitutional: Negative for fever, chills, diaphoresis and unexpected weight change.  Respiratory: Negative for cough, chest tightness and shortness of breath.   Cardiovascular: Negative for chest pain, palpitations and leg swelling.  Gastrointestinal: Positive for abdominal pain. Negative for nausea, vomiting, diarrhea, constipation,  blood in stool and abdominal distention.  Genitourinary: Negative for dysuria, urgency, frequency, hematuria, flank pain, decreased urine volume, discharge, penile swelling, scrotal swelling, genital sores, penile pain and testicular pain.  Musculoskeletal: Negative for myalgias, back pain and arthralgias.  Skin: Negative for color change and pallor.  Neurological: Negative for dizziness, syncope, weakness and light-headedness.  All other systems reviewed and are negative.     Allergies  Review of patient's allergies indicates no known allergies.  Home Medications   Prior to Admission medications   Medication Sig Start Date End Date Taking? Authorizing Provider  allopurinol (ZYLOPRIM) 100 MG tablet Take 100 mg by mouth daily.   Yes Historical Provider, MD  carvedilol (COREG) 12.5 MG tablet Take 12.5 mg by mouth daily.    Yes Historical Provider, MD  Fluticasone Furoate-Vilanterol (BREO ELLIPTA) 100-25 MCG/INH AEPB Inhale 1 puff into the lungs daily.    Yes Historical Provider, MD  glyBURIDE (DIABETA) 5 MG tablet Take 5 mg by mouth daily with breakfast.   Yes Historical Provider, MD  HUMALOG MIX 75/25 KWIKPEN (75-25) 100 UNIT/ML Kwikpen Inject 20-30 Units into the skin 2 (two) times daily. 30 units in the morning and 20 units at night. 09/29/15  Yes Historical Provider, MD  HYDROcodone-acetaminophen (NORCO) 7.5-325 MG tablet Take 1 tablet by mouth every 6 (six) hours as needed for moderate pain.   Yes Historical Provider, MD  linagliptin (TRADJENTA) 5 MG TABS tablet Take 5 mg by mouth daily.   Yes Historical Provider, MD  lisinopril (PRINIVIL,ZESTRIL) 2.5 MG tablet Take 2.5 mg by mouth daily.   Yes Historical  Provider, MD  mexiletine (MEXITIL) 150 MG capsule Take 150 mg by mouth 3 (three) times daily.   Yes Historical Provider, MD  Omega-3 Fatty Acids (FISH OIL) 1000 MG CAPS Take 1 capsule by mouth daily.    Yes Historical Provider, MD  potassium chloride SA (K-DUR,KLOR-CON) 20 MEQ tablet  Take 20 mEq by mouth 2 (two) times daily.   Yes Historical Provider, MD  simvastatin (ZOCOR) 20 MG tablet Take 20 mg by mouth daily.   Yes Historical Provider, MD  tamsulosin (FLOMAX) 0.4 MG CAPS capsule Take 0.4 mg by mouth daily after supper.    Yes Historical Provider, MD  tiotropium (SPIRIVA) 18 MCG inhalation capsule Place 18 mcg into inhaler and inhale daily.   Yes Historical Provider, MD  torsemide (DEMADEX) 20 MG tablet Take 20 mg by mouth daily.   Yes Historical Provider, MD  vitamin B-12 (CYANOCOBALAMIN) 100 MCG tablet Take 100 mcg by mouth daily.   Yes Historical Provider, MD  warfarin (COUMADIN) 10 MG tablet Take 10 mg by mouth daily.   Yes Historical Provider, MD  ciprofloxacin (CIPRO) 500 MG tablet Take 1 tablet (500 mg total) by mouth 2 (two) times daily. 10/09/15   Ellanore Vanhook C Lucia Mccreadie, PA-C  metroNIDAZOLE (FLAGYL) 500 MG tablet Take 1 tablet (500 mg total) by mouth 3 (three) times daily. 10/09/15   Lamya Lausch C Mialynn Shelvin, PA-C   BP 88/68 mmHg  Pulse 71  Temp(Src) 97.6 F (36.4 C) (Oral)  Resp 20  Ht  (1.93 m)  Wt 250 lb (113.399 kg)  BMI 30.44 kg/m2  SpO2 95% Physical Exam  Constitutional: He appears well-developed and well-nourished. No distress.  HENT:  Head: Normocephalic and atraumatic.  Eyes: Conjunctivae are normal. Pupils are equal, round, and reactive to light.  Cardiovascular: Normal rate, regular rhythm and normal heart sounds.   Pulmonary/Chest: Effort normal and breath sounds normal. No respiratory distress.  Abdominal: Soft. Bowel sounds are normal. There is tenderness in the right lower quadrant and left lower quadrant. There is no rigidity, no guarding, no CVA tenderness, no tenderness at McBurney's point and negative Murphy's sign. Hernia confirmed negative in the right inguinal area and confirmed negative in the left inguinal area.  Genitourinary: Rectum normal, testes normal and penis normal. Prostate is enlarged. Prostate is not tender. Cremasteric reflex is present.  Right testis shows no mass, no swelling and no tenderness. Left testis shows no mass, no swelling and no tenderness. Circumcised. No hypospadias, penile erythema or penile tenderness.  Musculoskeletal: He exhibits no edema or tenderness.  Lymphadenopathy:       Right: No inguinal adenopathy present.       Left: No inguinal adenopathy present.  Neurological: He is alert.  Skin: Skin is warm and dry. He is not diaphoretic.  Nursing note and vitals reviewed.   ED Course  Procedures (including critical care time) Labs Review Labs Reviewed  CBC WITH DIFFERENTIAL/PLATELET - Abnormal; Notable for the following:    WBC 13.4 (*)    RBC 3.81 (*)    Hemoglobin 12.2 (*)    HCT 35.9 (*)    RDW 16.1 (*)    Platelets 129 (*)    Neutro Abs 8.8 (*)    Monocytes Absolute 2.6 (*)    All other components within normal limits  COMPREHENSIVE METABOLIC PANEL - Abnormal; Notable for the following:    Sodium 134 (*)    Chloride 97 (*)    Glucose, Bld 139 (*)    BUN 93 (*)  Creatinine, Ser 2.68 (*)    ALT 14 (*)    Total Bilirubin 1.4 (*)    GFR calc non Af Amer 24 (*)    GFR calc Af Amer 27 (*)    All other components within normal limits  URINALYSIS, ROUTINE W REFLEX MICROSCOPIC (NOT AT [facility])  POC OCCULT BLOOD, ED    Imaging Review Ct Abdomen Pelvis Wo Contrast  10/09/2015  CLINICAL DATA:  Lower abdominal pain for 2 days, COPD, coronary artery disease, diabetes mellitus EXAM: CT ABDOMEN AND PELVIS WITHOUT CONTRAST TECHNIQUE: Multidetector CT imaging of the abdomen and pelvis was performed following the standard protocol without IV contrast. Sagittal and coronal MPR images reconstructed from axial data set. Patient drank dilute oral contrast for exam. IV contrast not utilized due to renal dysfunction, creatinine 2.86. COMPARISON:  None FINDINGS: Lung bases clear. Post median sternotomy with scattered atherosclerotic calcifications. Pacemaker leads RIGHT atrium, RIGHT ventricle and coronary sinus.  Lung bases clear. Gallbladder surgically absent. 1.5 x 1.3 cm diameter exophytic nodule posterior LEFT kidney, low-attenuation consistent with cyst. Liver, spleen, pancreas, kidneys, and adrenal glands otherwise normal. Scattered atherosclerotic calcifications without aortic aneurysm. Normal appendix. Diverticulosis of sigmoid colon with sigmoid wall thickening and pericolic inflammatory changes of the sigmoid mesocolon consistent with acute diverticulitis. No evidence of abscess or free intraperitoneal air. Stomach and remaining bowel loops unremarkable. No mass, adenopathy, or free fluid. Probable small BILATERAL inguinal hernias containing fat. Bladder, ureters and prostate gland grossly unremarkable. No acute osseous findings. IMPRESSION: Sigmoid diverticulitis. Small LEFT renal cyst. Tiny umbilical and probable small BILATERAL inguinal hernias containing fat. Electronically Signed   By: Ulyses Southward M.D.   On: 10/09/2015 18:08   I have personally reviewed and evaluated these images and lab results as part of my medical decision-making.   EKG Interpretation None      MDM   Final diagnoses:  Sigmoid diverticulitis    Stanley Castillo presents with bilateral lower abdominal pain for two days. Findings and plan of care discussed with Eber Hong, MD. Abdominal CT indicated. Suspect diverticulitis. Doubt prostatitis, epididimitis, testicular torsion. When asked, pt states he is not currently in pain as long as he is on his left side. Pain management order placed PRN. Discussed plan of care with pt and pt agrees.  CBC shows increased WBC count of 13.4 with abnormal lymphocytes. CMP shows signs of renal failure with creatinine of 2.68. Radiology department called and made aware of CMP results, to which they state that he will need a double dose of PO contrast and this will extend his stay in the ED. Pt made aware of these changes. Fecal Occult blood negative. UA results free from abnormalities.  6:02 PM  Pt pain still controlled. No changes on reevaluation. Pt still awaiting abdominal CT. CT reveals sigmoid diverticulitis. Flagyl and Cipro indicated. Pt discharged with instructions for how to prevent diverticulitis. Pt given instructions to have his INR checked in 48 hours since the ABX given can interact with coumadin. Pt states he understands the importance of this.  Pt noted to be hypotensive during visit today. Pt denies dizziness, states he feels normal while sitting and adds that his BP runs low. Check of previous visit vitals shows SBP of 99. Pt is not tachycardic, however, when he was stood up to ambulate, pt states he is dizzy and needs to sit back down. Will repeat bolus and admit pt.   8:44 PM Eber Hong, MD spoke with Dr. Sharl Ma, who advised to obtain  a lactate and admit pt to step down unit.  Anselm Pancoast, PA-C 10/09/15 1938  Anselm Pancoast, PA-C 10/09/15 1947  Anselm Pancoast, PA-C 10/09/15 2049  Eber Hong, MD 10/10/15 1155

## 2015-10-09 NOTE — Progress Notes (Signed)
ANTICOAGULATION CONSULT NOTE - Initial Consult  Pharmacy Consult for Coumadin Indication: atrial fibrillation  No Known Allergies  Patient Measurements: Height: 6\' 4"  (193 cm) Weight: 250 lb (113.399 kg) IBW/kg (Calculated) : 86.8 Heparin Dosing Weight:   Vital Signs: Temp: 97.6 F (36.4 C) (10/22 1509) Temp Source: Oral (10/22 1509) BP: 77/50 mmHg (10/22 2200) Pulse Rate: 74 (10/22 2200)  Labs:  Recent Labs  10/09/15 1435  HGB 12.2*  HCT 35.9*  PLT 129*  CREATININE 2.68*    Estimated Creatinine Clearance: 38.4 mL/min (by C-G formula based on Cr of 2.68).   Medical History: Past Medical History  Diagnosis Date  . COPD (chronic obstructive pulmonary disease)   . Coronary artery disease   . A-fib   . Diabetes mellitus without complication     Medications:   (Not in a hospital admission)  Assessment: 64 year old male who  has a past medical history of COPD (chronic obstructive pulmonary disease); Coronary artery disease; A-fib; and Diabetes mellitus without complication. Today presents to the hospital with complaint of abdominal pain for past 2 days Continuation of Coumadin PTA for  INR elevated on admission, 4.62  Goal of Therapy:  INR 2-3 Monitor platelets by anticoagulation protocol: Yes   Plan:  Hold Coumadin tonight due to elevated INR INR/PT daily  Raquel James, Monita Swier Bennett 10/09/2015,10:32 PM

## 2015-10-09 NOTE — ED Provider Notes (Addendum)
The patient is a 64 year old male, prior history of diverticulitis, prior history of coronary artery bypass grafting in the 1990s, has been in a good state of health until 36 hours ago when he developed lower abdominal discomfort several hours after eating a meal. No one else that ate had similar symptoms. His pain has been ongoing, lower abdomen, midline, less so in the bilateral lower pelvis. On exam the patient has significant tenderness in the lower abdomen over the suprapubic and right lower quadrants, less so on the left lower quadrant, genitourinary exam deferred, please see physician assistant note. Cardiac exam is normal, no murmurs, no distress, the patient speaks in full sentences, his abdomen is otherwise soft and nontender except for the localized parts. We'll obtain labs, CT scan, suspect diverticulitis, possibly appendicitis, would also consider urinary tract infection though less likely with no urinary symptoms.  ED ECG REPORT  I personally interpreted this EKG   Date: 10/09/2015   Rate: 74  Rhythm: paced rhythm  QRS Axis: indeterminate  Intervals: normal  ST/T Wave abnormalities: normal  Conduction Disutrbances:none  Narrative Interpretation:   Old EKG Reviewed: none available   D/w Dr. Sharl Ma who agrees with admission - step down - will continue fluids.  BP has been very low - in the 80's.  The patient continued to stay hypotensive, the decision was made to admit the patient, he ended up going to the intensive care unit and despite multiple fluid boluses state hypotensive thus requiring pressor medications  CRITICAL CARE Performed by: Vida Roller Total critical care time: 35 Critical care time was exclusive of separately billable procedures and treating other patients. Critical care was necessary to treat or prevent imminent or life-threatening deterioration. Critical care was time spent personally by me on the following activities: development of treatment plan with patient  and/or surrogate as well as nursing, discussions with consultants, evaluation of patient's response to treatment, examination of patient, obtaining history from patient or surrogate, ordering and performing treatments and interventions, ordering and review of laboratory studies, ordering and review of radiographic studies, pulse oximetry and re-evaluation of patient's condition.   Meds given in ED:  Medications  morphine 4 MG/ML injection 4 mg (4 mg Intravenous Given 10/09/15 1514)  sodium chloride 0.9 % bolus 500 mL (0 mLs Intravenous Stopped 10/09/15 1547)  metroNIDAZOLE (FLAGYL) tablet 500 mg (500 mg Oral Given 10/09/15 1908)  ciprofloxacin (CIPRO) IVPB 400 mg (0 mg Intravenous Stopped 10/09/15 2009)  sodium chloride 0.9 % bolus 500 mL (0 mLs Intravenous Stopped 10/09/15 2040)     Medical screening examination/treatment/procedure(s) were performed by non-physician practitioner and as supervising physician I was immediately available for consultation/collaboration.    Eber Hong, MD 10/09/15 5643  Eber Hong, MD 10/10/15 [phone]

## 2015-10-10 ENCOUNTER — Encounter (HOSPITAL_COMMUNITY): Payer: Self-pay | Admitting: *Deleted

## 2015-10-10 ENCOUNTER — Observation Stay (HOSPITAL_BASED_OUTPATIENT_CLINIC_OR_DEPARTMENT_OTHER): Payer: Medicare Other

## 2015-10-10 DIAGNOSIS — I9589 Other hypotension: Secondary | ICD-10-CM

## 2015-10-10 DIAGNOSIS — Z794 Long term (current) use of insulin: Secondary | ICD-10-CM | POA: Diagnosis not present

## 2015-10-10 DIAGNOSIS — Z950 Presence of cardiac pacemaker: Secondary | ICD-10-CM | POA: Diagnosis not present

## 2015-10-10 DIAGNOSIS — N179 Acute kidney failure, unspecified: Secondary | ICD-10-CM | POA: Diagnosis not present

## 2015-10-10 DIAGNOSIS — D649 Anemia, unspecified: Secondary | ICD-10-CM | POA: Diagnosis present

## 2015-10-10 DIAGNOSIS — Z951 Presence of aortocoronary bypass graft: Secondary | ICD-10-CM | POA: Diagnosis not present

## 2015-10-10 DIAGNOSIS — I482 Chronic atrial fibrillation, unspecified: Secondary | ICD-10-CM | POA: Diagnosis present

## 2015-10-10 DIAGNOSIS — D696 Thrombocytopenia, unspecified: Secondary | ICD-10-CM | POA: Diagnosis present

## 2015-10-10 DIAGNOSIS — E860 Dehydration: Secondary | ICD-10-CM | POA: Diagnosis present

## 2015-10-10 DIAGNOSIS — I5022 Chronic systolic (congestive) heart failure: Secondary | ICD-10-CM | POA: Diagnosis present

## 2015-10-10 DIAGNOSIS — J449 Chronic obstructive pulmonary disease, unspecified: Secondary | ICD-10-CM | POA: Diagnosis present

## 2015-10-10 DIAGNOSIS — Z7901 Long term (current) use of anticoagulants: Secondary | ICD-10-CM | POA: Diagnosis not present

## 2015-10-10 DIAGNOSIS — Z6830 Body mass index (BMI) 30.0-30.9, adult: Secondary | ICD-10-CM | POA: Diagnosis not present

## 2015-10-10 DIAGNOSIS — R791 Abnormal coagulation profile: Secondary | ICD-10-CM | POA: Diagnosis present

## 2015-10-10 DIAGNOSIS — I501 Left ventricular failure: Secondary | ICD-10-CM | POA: Diagnosis not present

## 2015-10-10 DIAGNOSIS — R109 Unspecified abdominal pain: Secondary | ICD-10-CM | POA: Diagnosis present

## 2015-10-10 DIAGNOSIS — I2510 Atherosclerotic heart disease of native coronary artery without angina pectoris: Secondary | ICD-10-CM | POA: Diagnosis present

## 2015-10-10 DIAGNOSIS — K5732 Diverticulitis of large intestine without perforation or abscess without bleeding: Secondary | ICD-10-CM | POA: Diagnosis present

## 2015-10-10 DIAGNOSIS — K5792 Diverticulitis of intestine, part unspecified, without perforation or abscess without bleeding: Secondary | ICD-10-CM | POA: Diagnosis present

## 2015-10-10 DIAGNOSIS — I959 Hypotension, unspecified: Secondary | ICD-10-CM | POA: Diagnosis not present

## 2015-10-10 DIAGNOSIS — E669 Obesity, unspecified: Secondary | ICD-10-CM | POA: Diagnosis present

## 2015-10-10 DIAGNOSIS — E119 Type 2 diabetes mellitus without complications: Secondary | ICD-10-CM

## 2015-10-10 DIAGNOSIS — I509 Heart failure, unspecified: Secondary | ICD-10-CM

## 2015-10-10 LAB — GLUCOSE, CAPILLARY
GLUCOSE-CAPILLARY: 211 mg/dL — AB (ref 65–99)
GLUCOSE-CAPILLARY: 201 mg/dL — AB (ref 65–99)
Glucose-Capillary: 120 mg/dL — ABNORMAL HIGH (ref 65–99)
Glucose-Capillary: 126 mg/dL — ABNORMAL HIGH (ref 65–99)

## 2015-10-10 LAB — COMPREHENSIVE METABOLIC PANEL
ALK PHOS: 46 U/L (ref 38–126)
ALT: 11 U/L — AB (ref 17–63)
AST: 17 U/L (ref 15–41)
Albumin: 3.3 g/dL — ABNORMAL LOW (ref 3.5–5.0)
Anion gap: 9 (ref 5–15)
BILIRUBIN TOTAL: 1.6 mg/dL — AB (ref 0.3–1.2)
BUN: 85 mg/dL — AB (ref 6–20)
CO2: 22 mmol/L (ref 22–32)
CREATININE: 2.23 mg/dL — AB (ref 0.61–1.24)
Calcium: 7.8 mg/dL — ABNORMAL LOW (ref 8.9–10.3)
Chloride: 103 mmol/L (ref 101–111)
GFR calc Af Amer: 34 mL/min — ABNORMAL LOW (ref >60)
GFR, EST NON AFRICAN AMERICAN: 29 mL/min — AB (ref >60)
Glucose, Bld: 95 mg/dL (ref 65–99)
Potassium: 3.5 mmol/L (ref 3.5–5.1)
Sodium: 134 mmol/L — ABNORMAL LOW (ref 135–145)
TOTAL PROTEIN: 6.3 g/dL — AB (ref 6.5–8.1)

## 2015-10-10 LAB — LACTIC ACID, PLASMA: LACTIC ACID, VENOUS: 1 mmol/L (ref 0.5–2.0)

## 2015-10-10 LAB — CBC
HEMATOCRIT: 31.1 % — AB (ref 39.0–52.0)
Hemoglobin: 10.5 g/dL — ABNORMAL LOW (ref 13.0–17.0)
MCH: 31.8 pg (ref 26.0–34.0)
MCHC: 33.8 g/dL (ref 30.0–36.0)
MCV: 94.2 fL (ref 78.0–100.0)
Platelets: 111 10*3/uL — ABNORMAL LOW (ref 150–400)
RBC: 3.3 MIL/uL — AB (ref 4.22–5.81)
RDW: 15.9 % — ABNORMAL HIGH (ref 11.5–15.5)
WBC: 8.8 10*3/uL (ref 4.0–10.5)

## 2015-10-10 LAB — PROTIME-INR
INR: 5.43 (ref 0.00–1.49)
Prothrombin Time: 47.8 seconds — ABNORMAL HIGH (ref 11.6–15.2)

## 2015-10-10 LAB — MRSA PCR SCREENING: MRSA BY PCR: POSITIVE — AB

## 2015-10-10 MED ORDER — PHENYLEPHRINE HCL 10 MG/ML IJ SOLN
0.0000 ug/min | INTRAVENOUS | Status: DC
  Administered 2015-10-10: 30 ug/min via INTRAVENOUS
  Administered 2015-10-10: 50 ug/min via INTRAVENOUS
  Administered 2015-10-10: 20 ug/min via INTRAVENOUS
  Filled 2015-10-10: qty 1

## 2015-10-10 MED ORDER — SODIUM CHLORIDE 0.9 % IV SOLN
INTRAVENOUS | Status: DC
  Administered 2015-10-10 – 2015-10-11 (×2): via INTRAVENOUS

## 2015-10-10 MED ORDER — SODIUM CHLORIDE 0.9 % IV BOLUS (SEPSIS)
500.0000 mL | Freq: Once | INTRAVENOUS | Status: AC
  Administered 2015-10-10: 500 mL via INTRAVENOUS

## 2015-10-10 MED ORDER — DEXTROSE 5 % IV SOLN
0.0000 ug/min | INTRAVENOUS | Status: DC
  Administered 2015-10-10: 60 ug/min via INTRAVENOUS
  Administered 2015-10-10: 40 ug/min via INTRAVENOUS
  Filled 2015-10-10 (×2): qty 4

## 2015-10-10 MED ORDER — PHENYLEPHRINE HCL 10 MG/ML IJ SOLN
INTRAMUSCULAR | Status: AC
  Filled 2015-10-10: qty 1

## 2015-10-10 MED ORDER — WARFARIN - PHARMACIST DOSING INPATIENT: Status: DC

## 2015-10-10 NOTE — Progress Notes (Signed)
ANTICOAGULATION CONSULT NOTE -  Pharmacy Consult for Coumadin Indication: atrial fibrillation  No Known Allergies  Patient Measurements: Height: 6\' 4"  (193 cm) Weight: 255 lb 1.2 oz (115.7 kg) IBW/kg (Calculated) : 86.8 Heparin Dosing Weight:   Vital Signs: Temp: 96.5 F (35.8 C) (10/23 0744) Temp Source: Oral (10/23 0744) BP: 126/74 mmHg (10/23 0820) Pulse Rate: 60 (10/23 0820)  Labs:  Recent Labs  10/09/15 1435 10/10/15 0444  HGB 12.2* 10.5*  HCT 35.9* 31.1*  PLT 129* 111*  LABPROT 42.3* 47.8*  INR 4.62* 5.43*  CREATININE 2.68* 2.23*    Estimated Creatinine Clearance: 46.6 mL/min (by C-G formula based on Cr of 2.23).   Medical History: Past Medical History  Diagnosis Date  . COPD (chronic obstructive pulmonary disease) (HCC)   . Coronary artery disease   . A-fib (HCC)   . Diabetes mellitus without complication (HCC)     Medications:  Prescriptions prior to admission  Medication Sig Dispense Refill Last Dose  . allopurinol (ZYLOPRIM) 100 MG tablet Take 100 mg by mouth daily.   10/09/2015 at Unknown time  . carvedilol (COREG) 12.5 MG tablet Take 12.5 mg by mouth daily.    10/09/2015 at 0630  . Fluticasone Furoate-Vilanterol (BREO ELLIPTA) 100-25 MCG/INH AEPB Inhale 1 puff into the lungs daily.    10/09/2015 at Unknown time  . glyBURIDE (DIABETA) 5 MG tablet Take 5 mg by mouth daily with breakfast.   10/09/2015 at Unknown time  . HUMALOG MIX 75/25 KWIKPEN (75-25) 100 UNIT/ML Kwikpen Inject 20-30 Units into the skin 2 (two) times daily. 30 units in the morning and 20 units at night.   10/09/2015 at Unknown time  . HYDROcodone-acetaminophen (NORCO) 7.5-325 MG tablet Take 1 tablet by mouth every 6 (six) hours as needed for moderate pain.   10/08/2015 at Unknown time  . linagliptin (TRADJENTA) 5 MG TABS tablet Take 5 mg by mouth daily.   10/08/2015 at Unknown time  . lisinopril (PRINIVIL,ZESTRIL) 2.5 MG tablet Take 2.5 mg by mouth daily.   10/09/2015 at Unknown time   . mexiletine (MEXITIL) 150 MG capsule Take 150 mg by mouth 3 (three) times daily.   10/09/2015 at Unknown time  . Omega-3 Fatty Acids (FISH OIL) 1000 MG CAPS Take 1 capsule by mouth daily.    10/09/2015 at Unknown time  . potassium chloride SA (K-DUR,KLOR-CON) 20 MEQ tablet Take 20 mEq by mouth 2 (two) times daily.   10/09/2015 at Unknown time  . simvastatin (ZOCOR) 20 MG tablet Take 20 mg by mouth daily.   10/09/2015 at Unknown time  . tamsulosin (FLOMAX) 0.4 MG CAPS capsule Take 0.4 mg by mouth daily after supper.    10/08/2015 at Unknown time  . tiotropium (SPIRIVA) 18 MCG inhalation capsule Place 18 mcg into inhaler and inhale daily.   10/09/2015 at Unknown time  . torsemide (DEMADEX) 20 MG tablet Take 20 mg by mouth daily.   10/09/2015 at Unknown time  . vitamin B-12 (CYANOCOBALAMIN) 100 MCG tablet Take 100 mcg by mouth daily.   10/09/2015 at Unknown time  . warfarin (COUMADIN) 10 MG tablet Take 10 mg by mouth daily.   10/08/2015 at Unknown time    Assessment: 64 year old male who  has a past medical history of COPD (chronic obstructive pulmonary disease); Coronary artery disease; A-fib; and Diabetes mellitus without complication. Today presents to the hospital with complaint of abdominal pain for past 2 days Continuation of Coumadin PTA for  INR elevated on admission, 4.62 INR increased to  5.43, even though no Coumadin given last evening  Goal of Therapy:  INR 2-3 Monitor platelets by anticoagulation protocol: Yes   Plan:  Hold Coumadin tonight due to elevated INR INR/PT daily  Jheremy Boger Bennett 10/10/2015,8:35 AM

## 2015-10-10 NOTE — Progress Notes (Signed)
CRITICAL VALUE ALERT  Critical value received:  INR 5.43  Date of notification:  10/10/15  Time of notification:  627  Critical value read back: yes  Nurse who received alert:  S. Hammock RN  MD notified (1st page):  Dr. Drusilla Kanner  Time of first page:  [phone]  MD notified (2nd page):  Time of second page:  Responding MD: Dr. Drusilla Kanner  Time MD responded:  [phone]   No new orders at this time

## 2015-10-10 NOTE — Progress Notes (Signed)
[facility] PROGRESS NOTE  Stanley Castillo ZOX:[id number] DOB: 02-10-1951 DOA: 10/09/2015 PCP: No primary care provider on file.   Code Status: Full code Family Communication: Family not available; discussed with patient Disposition Plan: Discharge when clinically appropriate, likely in a few days.   Consultants:  None  Procedures:  2-D echocardiogram pending  Antibiotics:  Cipro 10/22>>  Flagyl 10/22>>  HPI/Subjective: Patient feels a little better, but complains of overall weakness. He does have some lower abdominal discomfort. He had a semi-loose bowel movement late last night. He had some loose stools at home prior to admission.  Objective: Filed Vitals:   10/10/15 0820  BP: 126/74  Pulse: 60  Temp:   Resp: 20    Intake/Output Summary (Last 24 hours) at 10/10/15 0952 Last data filed at 10/10/15 0800  Gross per 24 hour  Intake 4729.42 ml  Output   1075 ml  Net 3654.42 ml   Filed Weights   10/09/15 1358 10/09/15 2248 10/10/15 0500  Weight: 113.399 kg (250 lb) 115.2 kg (253 lb 15.5 oz) 115.7 kg (255 lb 1.2 oz)    Exam:   General:  Large framed 64 year old man laying in bed, in no acute distress.  Cardiovascular: Irregular, irregular  Respiratory: Clear enterally with decreased breath sounds in the bases. Breathing nonlabored.  Abdomen: Obese, hypoactive bowel sounds, mildly to moderately tender left lower quadrant; no significant distention.  Musculoskeletal/extremities: No pedal edema. No acute hot red joints.  Neurologic: The patient is alert and oriented 3. Cranial nerves II through XII are intact.  Data Reviewed: Basic Metabolic Panel:  Recent Labs Lab 10/09/15 1435 10/10/15 0444  NA 134* 134*  K 4.2 3.5  CL 97* 103  CO2 25 22  GLUCOSE 139* 95  BUN 93* 85*  CREATININE 2.68* 2.23*  CALCIUM 9.0 7.8*   Liver Function Tests:  Recent Labs Lab 10/09/15 1435 10/10/15 0444  AST 21 17  ALT 14* 11*  ALKPHOS 56 46  BILITOT 1.4*  1.6*  PROT 7.8 6.3*  ALBUMIN 4.1 3.3*   No results for input(s): LIPASE, AMYLASE in the last 168 hours. No results for input(s): AMMONIA in the last 168 hours. CBC:  Recent Labs Lab 10/09/15 1435 10/10/15 0444  WBC 13.4* 8.8  NEUTROABS 8.8*  --   HGB 12.2* 10.5*  HCT 35.9* 31.1*  MCV 94.2 94.2  PLT 129* 111*   Cardiac Enzymes: No results for input(s): CKTOTAL, CKMB, CKMBINDEX, TROPONINI in the last 168 hours. BNP (last 3 results) No results for input(s): BNP in the last 8760 hours.  ProBNP (last 3 results) No results for input(s): PROBNP in the last 8760 hours.  CBG:  Recent Labs Lab 10/09/15 2310 10/10/15 0737  GLUCAP 163* 120*    Recent Results (from the past 240 hour(s))  MRSA PCR Screening     Status: Abnormal   Collection Time: 10/09/15 11:55 PM  Result Value Ref Range Status   MRSA by PCR POSITIVE (A) NEGATIVE Final    Comment:        The GeneXpert MRSA Assay (FDA approved for NASAL specimens only), is one component of a comprehensive MRSA colonization surveillance program. It is not intended to diagnose MRSA infection nor to guide or monitor treatment for MRSA infections. CRITICAL RESULT CALLED TO, READ BACK BY AND VERIFIED WITH: LEE, B AT 8119 ON 10/10/2015 BY WOODS, M      Studies: Ct Abdomen Pelvis Wo Contrast  10/09/2015  CLINICAL DATA:  Lower abdominal pain for 2 days, COPD,  coronary artery disease, diabetes mellitus EXAM: CT ABDOMEN AND PELVIS WITHOUT CONTRAST TECHNIQUE: Multidetector CT imaging of the abdomen and pelvis was performed following the standard protocol without IV contrast. Sagittal and coronal MPR images reconstructed from axial data set. Patient drank dilute oral contrast for exam. IV contrast not utilized due to renal dysfunction, creatinine 2.86. COMPARISON:  None FINDINGS: Lung bases clear. Post median sternotomy with scattered atherosclerotic calcifications. Pacemaker leads RIGHT atrium, RIGHT ventricle and coronary sinus.  Lung bases clear. Gallbladder surgically absent. 1.5 x 1.3 cm diameter exophytic nodule posterior LEFT kidney, low-attenuation consistent with cyst. Liver, spleen, pancreas, kidneys, and adrenal glands otherwise normal. Scattered atherosclerotic calcifications without aortic aneurysm. Normal appendix. Diverticulosis of sigmoid colon with sigmoid wall thickening and pericolic inflammatory changes of the sigmoid mesocolon consistent with acute diverticulitis. No evidence of abscess or free intraperitoneal air. Stomach and remaining bowel loops unremarkable. No mass, adenopathy, or free fluid. Probable small BILATERAL inguinal hernias containing fat. Bladder, ureters and prostate gland grossly unremarkable. No acute osseous findings. IMPRESSION: Sigmoid diverticulitis. Small LEFT renal cyst. Tiny umbilical and probable small BILATERAL inguinal hernias containing fat. Electronically Signed   By: Ulyses Southward M.D.   On: 10/09/2015 18:08    Scheduled Meds: . ciprofloxacin  400 mg Intravenous Q12H  . insulin aspart  0-9 Units Subcutaneous TID WC  . insulin aspart protamine- aspart  20 Units Subcutaneous BID  . metronidazole  500 mg Intravenous Q8H  . mexiletine  150 mg Oral TID  . simvastatin  20 mg Oral Daily  . tiotropium  18 mcg Inhalation Daily  . Warfarin - Pharmacist Dosing Inpatient   Does not apply Q24H   Continuous Infusions: . sodium chloride 125 mL/hr at 10/10/15 0052  . phenylephrine (NEO-SYNEPHRINE) Adult infusion 40 mcg/min (10/10/15 0717)   Assessment and plan: Principal Problem:   Sigmoid diverticulitis Active Problems:   Hypotension arterial   Chronic CHF (congestive heart failure) (HCC)   CAD (coronary artery disease)   Chronic anticoagulation   Supratherapeutic INR   Chronic atrial fibrillation (HCC)   Acute kidney injury (HCC)   Thrombocytopenia (HCC)   Normocytic anemia   Diabetes mellitus without complication (HCC)    1. Acute sigmoid diverticulitis. There was no  evidence of abscess or free air on the CT of the abdomen and pelvis. Will continue Cipro and Flagyl IV as started. His white blood cell count has improved. We'll continue when necessary IV hydromorphone and when necessary IV Zofran. Continue clear liquid diet without advancement yet. - Will order C.diff PCR to rule out superimposed infection. Hypotension The patient's blood pressure was in the 80s systolically in the ED, but has intermittently dropped to the 70s. He was given several boluses of normal saline. However, his blood pressure improved marginally. Therefore, Neo-Synephrine has been started and will be titrated accordingly. Etiology likely volume depletion coupled with antihypertensive medications; less likely sepsis associated hypotension as patient is not febrile, he is not tachycardic, and his lactic acid is within normal limits. Chronic CHF. Patient has a history of congestive heart failure, but his ejection fraction is unknown. He is visiting from [place] and that is where his cardiologist is. He is treated chronically with torsemide, carvedilol, and lisinopril. All are on hold due to his hypotension. He does not appear to be decompensated at this time, but with vigorous IV fluids, his pulmonary status will be followed closely. -Echocardiogram was ordered for evaluation; results pending. CAD with history of CABG We'll continue statin. Patient denies chest  pain. Chronic atrial fibrillation and history of pacemaker. Patient is treated chronically with carvedilol for rate control and Coumadin for anticoagulation. Carvedilol is on hold because of his  hypotension. Patient's INR is supratherapeutic at 5.43. Will hold Coumadin until his INR is within the therapeutic range. Pharmacy will dose. No evidence of GI or GU bleeding. Type 2 diabetes mellitus. Patient is treated with 70/30 insulin and glyburide chronically. We'll discontinue both as his blood glucose is trending downward. We'll  continue sliding scale NovoLog as started. Will order hemoglobin A1c. Acute kidney injury. Patient baseline creatinine is unknown. Given his clinical scenario, he probably has acute renal injury secondary to hypoperfusion and prerenal azotemia. Torsemide and lisinopril are being held. He is being hydrated with IV fluids. We'll continue to monitor his renal function. Normocytic anemia and thrombocytopenia. Patient denies any recent GI or GU bleeding. The decrease in his hemoglobin and platelet count may be secondary to infection and the dilutional effects of IV fluids. Will order TSH and anemia panel for further evaluation.  Time spent: Critical care time 45 minutes.    [facility]  [facility] Pager [phone]. If 7PM-7AM, please contact night-coverage at [email or web address], password [facility] 10/10/2015, 9:52 AM  LOS: 1 day

## 2015-10-10 NOTE — Progress Notes (Addendum)
Patient has persistent hypotension, no change in mental status. Called and discussed with E link Dr Arsenio Loader, who recommends giving fluid boluses up to 3 L as patient is likely dehydrated. Lactic acid is 1.0. And if still no improvement then consider Neo-Synephrine for pressor support. Will continue with the fluid boluses 500 mL at a time as he has a history of CHF. Will obtain echocardiogram in a.m.

## 2015-10-11 DIAGNOSIS — D649 Anemia, unspecified: Secondary | ICD-10-CM

## 2015-10-11 DIAGNOSIS — N179 Acute kidney failure, unspecified: Secondary | ICD-10-CM

## 2015-10-11 DIAGNOSIS — I5022 Chronic systolic (congestive) heart failure: Secondary | ICD-10-CM

## 2015-10-11 DIAGNOSIS — Z7901 Long term (current) use of anticoagulants: Secondary | ICD-10-CM

## 2015-10-11 DIAGNOSIS — D696 Thrombocytopenia, unspecified: Secondary | ICD-10-CM

## 2015-10-11 DIAGNOSIS — I482 Chronic atrial fibrillation: Secondary | ICD-10-CM

## 2015-10-11 DIAGNOSIS — K5732 Diverticulitis of large intestine without perforation or abscess without bleeding: Principal | ICD-10-CM

## 2015-10-11 LAB — CBC
HEMATOCRIT: 34.4 % — AB (ref 39.0–52.0)
HEMOGLOBIN: 11.5 g/dL — AB (ref 13.0–17.0)
MCH: 31.3 pg (ref 26.0–34.0)
MCHC: 33.4 g/dL (ref 30.0–36.0)
MCV: 93.7 fL (ref 78.0–100.0)
Platelets: 131 10*3/uL — ABNORMAL LOW (ref 150–400)
RBC: 3.67 MIL/uL — ABNORMAL LOW (ref 4.22–5.81)
RDW: 16 % — AB (ref 11.5–15.5)
WBC: 10.6 10*3/uL — AB (ref 4.0–10.5)

## 2015-10-11 LAB — VITAMIN B12: VITAMIN B 12: 761 pg/mL (ref 180–914)

## 2015-10-11 LAB — IRON AND TIBC
IRON: 39 ug/dL — AB (ref 45–182)
SATURATION RATIOS: 12 % — AB (ref 17.9–39.5)
TIBC: 326 ug/dL (ref 250–450)
UIBC: 287 ug/dL

## 2015-10-11 LAB — BASIC METABOLIC PANEL
Anion gap: 8 (ref 5–15)
BUN: 53 mg/dL — ABNORMAL HIGH (ref 6–20)
CHLORIDE: 107 mmol/L (ref 101–111)
CO2: 20 mmol/L — ABNORMAL LOW (ref 22–32)
CREATININE: 1.56 mg/dL — AB (ref 0.61–1.24)
Calcium: 8.6 mg/dL — ABNORMAL LOW (ref 8.9–10.3)
GFR calc non Af Amer: 45 mL/min — ABNORMAL LOW (ref >60)
GFR, EST AFRICAN AMERICAN: 52 mL/min — AB (ref >60)
Glucose, Bld: 170 mg/dL — ABNORMAL HIGH (ref 65–99)
Potassium: 4.2 mmol/L (ref 3.5–5.1)
Sodium: 135 mmol/L (ref 135–145)

## 2015-10-11 LAB — GLUCOSE, CAPILLARY
GLUCOSE-CAPILLARY: 183 mg/dL — AB (ref 65–99)
GLUCOSE-CAPILLARY: 236 mg/dL — AB (ref 65–99)
Glucose-Capillary: 171 mg/dL — ABNORMAL HIGH (ref 65–99)
Glucose-Capillary: 294 mg/dL — ABNORMAL HIGH (ref 65–99)

## 2015-10-11 LAB — HEMOGLOBIN A1C
HEMOGLOBIN A1C: 7.7 % — AB (ref 4.8–5.6)
MEAN PLASMA GLUCOSE: 174 mg/dL

## 2015-10-11 LAB — PROTIME-INR
INR: 3.87 — ABNORMAL HIGH (ref 0.00–1.49)
Prothrombin Time: 37 seconds — ABNORMAL HIGH (ref 11.6–15.2)

## 2015-10-11 LAB — C DIFFICILE QUICK SCREEN W PCR REFLEX
C Diff antigen: NEGATIVE
C Diff interpretation: NEGATIVE
C Diff toxin: NEGATIVE

## 2015-10-11 LAB — TSH: TSH: 1.717 u[IU]/mL (ref 0.350–4.500)

## 2015-10-11 LAB — FERRITIN: Ferritin: 334 ng/mL (ref 24–336)

## 2015-10-11 LAB — FOLATE: FOLATE: 15 ng/mL (ref >5.9)

## 2015-10-11 MED ORDER — MUPIROCIN 2 % EX OINT
1.0000 "application " | TOPICAL_OINTMENT | Freq: Two times a day (BID) | CUTANEOUS | Status: DC
  Administered 2015-10-11 – 2015-10-12 (×3): 1 via NASAL
  Filled 2015-10-11: qty 22

## 2015-10-11 MED ORDER — CHLORHEXIDINE GLUCONATE CLOTH 2 % EX PADS: 6.0000 | MEDICATED_PAD | Freq: Every day | CUTANEOUS | Status: DC

## 2015-10-11 MED ORDER — FUROSEMIDE 10 MG/ML IJ SOLN
20.0000 mg | Freq: Once | INTRAMUSCULAR | Status: AC
  Administered 2015-10-11: 20 mg via INTRAVENOUS
  Filled 2015-10-11: qty 2

## 2015-10-11 NOTE — Progress Notes (Signed)
Inpatient Diabetes Program Recommendations  AACE/ADA: New Consensus Statement on Inpatient Glycemic Control (2015)  Target Ranges:  Prepandial:   less than 140 mg/dL      Peak postprandial:   less than 180 mg/dL (1-2 hours)      Critically ill patients:  140 - 180 mg/dL   Results for JERMEL, PAWELSKI (MRN [id number]) as of 10/11/2015 11:50  Ref. Range 10/10/2015 07:37 10/10/2015 11:28 10/10/2015 16:53 10/10/2015 21:01  Glucose-Capillary Latest Ref Range: 65-99 mg/dL 409 (H) 811 (H) 914 (H) 201 (H)    Results for EUSTACE, ROMANOSKI (MRN [id number]) as of 10/11/2015 11:50  Ref. Range 10/11/2015 07:55 10/11/2015 11:31  Glucose-Capillary Latest Ref Range: 65-99 mg/dL 086 (H) 578 (H)     Admit Diverticulitis.   History: DM, COPD, CKD3  Home DM Meds: Glyburide 5 mg daily        Tradjenta 5 mg daily        Humalog 75/25 insulin- 30 units AM/ 20 units PM  Current Insulin Orders: Novolog Sensitive SSI (0-9 units) TID AC    MD- Please consider starting 25% of patient's home dose of pre-mixed insulin-  Recommend starting 70/30 insulin-  8 units with breakfast and 5 units with supper    --Will follow patient during hospitalization--  Ambrose Finland RN, MSN, CDE Diabetes Coordinator Inpatient Glycemic Control Team Team Pager: [phone] (8a-5p)

## 2015-10-11 NOTE — Care Management Note (Signed)
Case Management Note  Patient Details  Name: Loni Peaches MRN: [id number] Date of Birth: 03-Aug-1951  Subjective/Objective:                  Pt is from home, lives alone. Pt is from [place] but here visiting family. Pt has no HH services, DME or med need prior to admission. Pt ind with ADL's.   Action/Plan: Pt plans to return home with self care. DC anticipated in next 24 hours. No CM needs.   Expected Discharge Date:  10/12/15               Expected Discharge Plan:  Home/Self Care  In-House Referral:  NA  Discharge planning Services  CM Consult  Post Acute Care Choice:  NA Choice offered to:  NA  DME Arranged:    DME Agency:     HH Arranged:    HH Agency:     Status of Service:  Completed, signed off  Medicare Important Message Given:    Date Medicare IM Given:    Medicare IM give by:    Date Additional Medicare IM Given:    Additional Medicare Important Message give by:     If discussed at Long Length of Stay Meetings, dates discussed:    Additional Comments:  Malcolm Metro, RN 10/11/2015, 1:39 PM

## 2015-10-11 NOTE — Progress Notes (Signed)
ANTICOAGULATION CONSULT NOTE -  Pharmacy Consult for Coumadin Indication: atrial fibrillation  No Known Allergies  Patient Measurements: Height: 6\' 4"  (193 cm) Weight: 255 lb 1.2 oz (115.7 kg) IBW/kg (Calculated) : 86.8  Vital Signs: Temp: 97.5 F (36.4 C) (10/24 0800) Temp Source: Oral (10/24 0800) BP: 110/85 mmHg (10/24 0515) Pulse Rate: 76 (10/24 0515)  Labs:  Recent Labs  10/09/15 1435 10/10/15 0444 10/11/15 0436  HGB 12.2* 10.5* 11.5*  HCT 35.9* 31.1* 34.4*  PLT 129* 111* 131*  LABPROT 42.3* 47.8* 37.0*  INR 4.62* 5.43* 3.87*  CREATININE 2.68* 2.23* 1.56*   Estimated Creatinine Clearance: 66.6 mL/min (by C-G formula based on Cr of 1.56).   Medical History: Past Medical History  Diagnosis Date  . COPD (chronic obstructive pulmonary disease) (HCC)   . Coronary artery disease   . A-fib (HCC)   . Diabetes mellitus without complication (HCC)    Medications:  Prescriptions prior to admission  Medication Sig Dispense Refill Last Dose  . allopurinol (ZYLOPRIM) 100 MG tablet Take 100 mg by mouth daily.   10/09/2015 at Unknown time  . carvedilol (COREG) 12.5 MG tablet Take 12.5 mg by mouth daily.    10/09/2015 at 0630  . Fluticasone Furoate-Vilanterol (BREO ELLIPTA) 100-25 MCG/INH AEPB Inhale 1 puff into the lungs daily.    10/09/2015 at Unknown time  . glyBURIDE (DIABETA) 5 MG tablet Take 5 mg by mouth daily with breakfast.   10/09/2015 at Unknown time  . HUMALOG MIX 75/25 KWIKPEN (75-25) 100 UNIT/ML Kwikpen Inject 20-30 Units into the skin 2 (two) times daily. 30 units in the morning and 20 units at night.   10/09/2015 at Unknown time  . HYDROcodone-acetaminophen (NORCO) 7.5-325 MG tablet Take 1 tablet by mouth every 6 (six) hours as needed for moderate pain.   10/08/2015 at Unknown time  . linagliptin (TRADJENTA) 5 MG TABS tablet Take 5 mg by mouth daily.   10/08/2015 at Unknown time  . lisinopril (PRINIVIL,ZESTRIL) 2.5 MG tablet Take 2.5 mg by mouth daily.    10/09/2015 at Unknown time  . mexiletine (MEXITIL) 150 MG capsule Take 150 mg by mouth 3 (three) times daily.   10/09/2015 at Unknown time  . Omega-3 Fatty Acids (FISH OIL) 1000 MG CAPS Take 1 capsule by mouth daily.    10/09/2015 at Unknown time  . potassium chloride SA (K-DUR,KLOR-CON) 20 MEQ tablet Take 20 mEq by mouth 2 (two) times daily.   10/09/2015 at Unknown time  . simvastatin (ZOCOR) 20 MG tablet Take 20 mg by mouth daily.   10/09/2015 at Unknown time  . tamsulosin (FLOMAX) 0.4 MG CAPS capsule Take 0.4 mg by mouth daily after supper.    10/08/2015 at Unknown time  . tiotropium (SPIRIVA) 18 MCG inhalation capsule Place 18 mcg into inhaler and inhale daily.   10/09/2015 at Unknown time  . torsemide (DEMADEX) 20 MG tablet Take 20 mg by mouth daily.   10/09/2015 at Unknown time  . vitamin B-12 (CYANOCOBALAMIN) 100 MCG tablet Take 100 mcg by mouth daily.   10/09/2015 at Unknown time  . warfarin (COUMADIN) 10 MG tablet Take 10 mg by mouth daily.   10/08/2015 at Unknown time   Assessment: 64 year old male who  has a past medical history of COPD (chronic obstructive pulmonary disease); Coronary artery disease; A-fib; and Diabetes mellitus without complication. Today presents to the hospital with complaint of abdominal pain for past 2 days Continuation of Coumadin PTA for  INR elevated but is now trending down.  Goal of Therapy:  INR 2-3 Monitor platelets by anticoagulation protocol: Yes   Plan:  Hold Coumadin today  INR/PT daily  Margo Aye, Judyann Casasola A 10/11/2015,10:44 AM

## 2015-10-11 NOTE — Progress Notes (Signed)
[facility] PROGRESS NOTE  Stanley Castillo ZOX:[id number] DOB: [birth year] DOA: 10/09/2015 PCP: No primary care provider on file.  Assessment/Plan: 1. Acute sigmoid diverticulitis. There was no evidence of abscess or free air on the CT of the abdomen and pelvis. Will continue IV Cipro and Flagyl. WBC WNL. Reports no pain and last stool was overnight which was more formed. Will advance diet as tolerated.  2. Hypotension, resolved. Currently normotensive. Etiology likely volume depletion coupled with antihypertensive medications; less likely sepsis associated hypotension since he is not febrile, tachycardic, and his lactic acid is within normal limits. He was briefly started on phenylephrine infusion which was weaned off this morning. Continue to monitor blood pressures. Discontinue IVF in the setting of heart failure.  3. Chronic systolic CHF. EF 20-25% ECHO as below. Findings are likely chronic. No evidence of volume overload. Will monitor his pulmonary status closely in the setting of aggressive IV hydration. He is s/p AICD. 4. CAD with history of CABG. No complaints of chest pain at this time. 5. Chronic atrial fibrillation and history of pacemaker. Patient is on carvedilol for rate control and Coumadin for anticoagulation as outpatient. Carvedilol is on hold in the setting of hypotension. Patient's INR is supratherapeutic at 3.87 Will continue to hold Coumadin until his INR is within the therapeutic range 6. DM Type 2. Continue SSI. H1c pending.  7. AKI. Likely secondary to hypoperfusion and prerenal azotemia. Torsemide and lisinopril are being held. Will continue to monitor his renal function in the setting of IV hydration. Creatinine is trending down. 8. Normocytic anemia. Possibly related to infection and dilutional effects of IV fluids. No evidence of active bleeding. Hgb 11.5. TSH and anemia panel ordered.  9. Thrombocytopenia.  Possibly related to infection and dilutional effects of IV  fluids. Improving  Code Status: Full DVT prophylaxis: Warfarin  Family Communication: Discussed with patient who understands and has no concerns at this time. Disposition Plan: Discharge within 24 hours    Consultants:    Procedures:  ECHO Study Conclusions  - Left ventricle: Wall thickness was increased in a pattern of severe LVH. Systolic function was severely reduced. The estimated ejection fraction was in the range of 20% to 25%. Diffuse hypokinesis. - Mitral valve: There was severe regurgitation. - Left atrium: The atrium was severely dilated. - Right ventricle: The cavity size was severely dilated. Systolic function was moderately to severely reduced. - Right atrium: The atrium was severely dilated. - Tricuspid valve: There was severe regurgitation. - Pulmonary arteries: Systolic pressure was severely increased. PA peak pressure: 75 mm Hg (S).  Antibiotics:  Cipro 10/22>>  Flagyl 10/22>>  HPI/Subjective: Pain is well controlled. Denies vomiting, SOB, dizziness, or lightheadedness. Last bowel movement was last night. Was able to tolerate liquids.   Objective: Filed Vitals:   10/11/15 0515  BP: 110/85  Pulse: 76  Temp:   Resp: 17    Intake/Output Summary (Last 24 hours) at 10/11/15 0631 Last data filed at 10/11/15 0400  Gross per 24 hour  Intake 4949.06 ml  Output   2125 ml  Net 2824.06 ml   Filed Weights   10/09/15 1358 10/09/15 2248 10/10/15 0500  Weight: 113.399 kg (250 lb) 115.2 kg (253 lb 15.5 oz) 115.7 kg (255 lb 1.2 oz)    Exam:  General: NAD. Lying in bed and looks comfortable Cardiovascular: RRR, S1, S2  Respiratory: clear bilaterally, No wheezing, rales or rhonchi Abdomen: soft, non tender, no distention , bowel sounds normal Musculoskeletal: No edema b/l  Data Reviewed: Basic Metabolic Panel:  Recent Labs Lab 10/09/15 1435 10/10/15 0444  NA 134* 134*  K 4.2 3.5  CL 97* 103  CO2 25 22  GLUCOSE 139* 95  BUN 93*  85*  CREATININE 2.68* 2.23*  CALCIUM 9.0 7.8*   Liver Function Tests:  Recent Labs Lab 10/09/15 1435 10/10/15 0444  AST 21 17  ALT 14* 11*  ALKPHOS 56 46  BILITOT 1.4* 1.6*  PROT 7.8 6.3*  ALBUMIN 4.1 3.3*   CBC:  Recent Labs Lab 10/09/15 1435 10/10/15 0444  WBC 13.4* 8.8  NEUTROABS 8.8*  --   HGB 12.2* 10.5*  HCT 35.9* 31.1*  MCV 94.2 94.2  PLT 129* 111*    CBG:  Recent Labs Lab 10/09/15 2310 10/10/15 0737 10/10/15 1128 10/10/15 1653 10/10/15 2101  GLUCAP 163* 120* 211* 126* 201*    Recent Results (from the past 240 hour(s))  MRSA PCR Screening     Status: Abnormal   Collection Time: 10/09/15 11:55 PM  Result Value Ref Range Status   MRSA by PCR POSITIVE (A) NEGATIVE Final    Comment:        The GeneXpert MRSA Assay (FDA approved for NASAL specimens only), is one component of a comprehensive MRSA colonization surveillance program. It is not intended to diagnose MRSA infection nor to guide or monitor treatment for MRSA infections. CRITICAL RESULT CALLED TO, READ BACK BY AND VERIFIED WITH: LEE, B AT 0618 ON 10/10/2015 BY WOODS, M      Studies: Ct Abdomen Pelvis Wo Contrast  10/09/2015  CLINICAL DATA:  Lower abdominal pain for 2 days, COPD, coronary artery disease, diabetes mellitus EXAM: CT ABDOMEN AND PELVIS WITHOUT CONTRAST TECHNIQUE: Multidetector CT imaging of the abdomen and pelvis was performed following the standard protocol without IV contrast. Sagittal and coronal MPR images reconstructed from axial data set. Patient drank dilute oral contrast for exam. IV contrast not utilized due to renal dysfunction, creatinine 2.86. COMPARISON:  None FINDINGS: Lung bases clear. Post median sternotomy with scattered atherosclerotic calcifications. Pacemaker leads RIGHT atrium, RIGHT ventricle and coronary sinus. Lung bases clear. Gallbladder surgically absent. 1.5 x 1.3 cm diameter exophytic nodule posterior LEFT kidney, low-attenuation consistent with  cyst. Liver, spleen, pancreas, kidneys, and adrenal glands otherwise normal. Scattered atherosclerotic calcifications without aortic aneurysm. Normal appendix. Diverticulosis of sigmoid colon with sigmoid wall thickening and pericolic inflammatory changes of the sigmoid mesocolon consistent with acute diverticulitis. No evidence of abscess or free intraperitoneal air. Stomach and remaining bowel loops unremarkable. No mass, adenopathy, or free fluid. Probable small BILATERAL inguinal hernias containing fat. Bladder, ureters and prostate gland grossly unremarkable. No acute osseous findings. IMPRESSION: Sigmoid diverticulitis. Small LEFT renal cyst. Tiny umbilical and probable small BILATERAL inguinal hernias containing fat. Electronically Signed   By: Ulyses Southward M.D.   On: 10/09/2015 18:08    Scheduled Meds: . ciprofloxacin  400 mg Intravenous Q12H  . insulin aspart  0-9 Units Subcutaneous TID WC  . metronidazole  500 mg Intravenous Q8H  . mexiletine  150 mg Oral TID  . simvastatin  20 mg Oral Daily  . tiotropium  18 mcg Inhalation Daily  . Warfarin - Pharmacist Dosing Inpatient   Does not apply Q24H   Continuous Infusions: . sodium chloride 125 mL/hr at 10/11/15 0522  . phenylephrine (NEO-SYNEPHRINE) Adult infusion 20 mcg/min (10/11/15 0521)    Principal Problem:   Sigmoid diverticulitis Active Problems:   Chronic CHF (congestive heart failure) (HCC)   CAD (coronary artery disease)  Chronic anticoagulation   Supratherapeutic INR   Acute kidney injury (HCC)   Thrombocytopenia (HCC)   Normocytic anemia   Diabetes mellitus without complication (HCC)   Chronic atrial fibrillation (HCC)   Hypotension arterial   Diverticulitis    Time spent: 20 minutes     Erick Blinks, MD  [facility] Pager [phone]. If 7PM-7AM, please contact night-coverage at [email or web address], password [facility] 10/11/2015, 6:31 AM  LOS: 2 days     By signing my name below, I, Zadie Cleverly, attest  that this documentation has been prepared under the direction and in the presence of Erick Blinks, MD. Electronically signed: Zadie Cleverly, Scribe. 10/11/2015 8:41 AM   I, Dr. Erick Blinks, personally performed the services described in this documentaiton. All medical record entries made by the scribe were at my direction and in my presence. I have reviewed the chart and agree that the record reflects my personal performance and is accurate and complete  Erick Blinks, MD, 10/11/2015 10:43 AM

## 2015-10-12 DIAGNOSIS — E119 Type 2 diabetes mellitus without complications: Secondary | ICD-10-CM

## 2015-10-12 LAB — BASIC METABOLIC PANEL
Anion gap: 8 (ref 5–15)
BUN: 35 mg/dL — AB (ref 6–20)
CALCIUM: 8.8 mg/dL — AB (ref 8.9–10.3)
CO2: 23 mmol/L (ref 22–32)
CREATININE: 1.53 mg/dL — AB (ref 0.61–1.24)
Chloride: 106 mmol/L (ref 101–111)
GFR calc Af Amer: 54 mL/min — ABNORMAL LOW (ref >60)
GFR, EST NON AFRICAN AMERICAN: 46 mL/min — AB (ref >60)
GLUCOSE: 179 mg/dL — AB (ref 65–99)
Potassium: 4 mmol/L (ref 3.5–5.1)
Sodium: 137 mmol/L (ref 135–145)

## 2015-10-12 LAB — CBC
HCT: 33.3 % — ABNORMAL LOW (ref 39.0–52.0)
Hemoglobin: 11.1 g/dL — ABNORMAL LOW (ref 13.0–17.0)
MCH: 31.8 pg (ref 26.0–34.0)
MCHC: 33.3 g/dL (ref 30.0–36.0)
MCV: 95.4 fL (ref 78.0–100.0)
PLATELETS: 114 10*3/uL — AB (ref 150–400)
RBC: 3.49 MIL/uL — ABNORMAL LOW (ref 4.22–5.81)
RDW: 16 % — AB (ref 11.5–15.5)
WBC: 8.9 10*3/uL (ref 4.0–10.5)

## 2015-10-12 LAB — GLUCOSE, CAPILLARY
GLUCOSE-CAPILLARY: 180 mg/dL — AB (ref 65–99)
GLUCOSE-CAPILLARY: 272 mg/dL — AB (ref 65–99)

## 2015-10-12 LAB — PROTIME-INR
INR: 3.24 — ABNORMAL HIGH (ref 0.00–1.49)
Prothrombin Time: 32.5 seconds — ABNORMAL HIGH (ref 11.6–15.2)

## 2015-10-12 MED ORDER — CIPROFLOXACIN HCL 500 MG PO TABS: 500.0000 mg | ORAL_TABLET | Freq: Two times a day (BID) | ORAL | Status: AC

## 2015-10-12 MED ORDER — WARFARIN SODIUM 10 MG PO TABS: 10.0000 mg | ORAL_TABLET | Freq: Every day | ORAL | Status: AC

## 2015-10-12 MED ORDER — METRONIDAZOLE 500 MG PO TABS: 500.0000 mg | ORAL_TABLET | Freq: Three times a day (TID) | ORAL | Status: AC

## 2015-10-12 NOTE — Progress Notes (Signed)
Discharge instruction reviewed with patient. Prescriptions given to patient. Patient taken to lobby via wheelchair.

## 2015-10-12 NOTE — Care Management Important Message (Signed)
Important Message  Patient Details  Name: Stanley Castillo MRN: [id number] Date of Birth: May 08, 1951   Medicare Important Message Given:  Yes-second notification given    Malcolm Metro, RN 10/12/2015, 12:50 PM

## 2015-10-12 NOTE — Discharge Summary (Addendum)
Physician Discharge Summary  Stanley Castillo VHQ:[id number] DOB: 01/27/1951 DOA: 10/09/2015  PCP: No primary care provider on file.  Admit date: 10/09/2015 Discharge date: 10/12/2015  Time spent: 35 minutes  Recommendations for Outpatient Follow-up:  1. Follow-up with PCP in [place] in 1-2 weeks.  2. Lisinopril held due to acute kidney injury. Restart as outpatient when appropriate.    Discharge Diagnoses:  Principal Problem:   Sigmoid diverticulitis Active Problems:   Chronic systolic CHF (congestive heart failure) (HCC)   CAD (coronary artery disease)   Chronic anticoagulation   Supratherapeutic INR   Acute kidney injury (HCC)   Thrombocytopenia (HCC)   Normocytic anemia   Diabetes mellitus without complication (HCC)   Chronic atrial fibrillation (HCC)   Hypotension arterial   Diverticulitis   Discharge Condition: Improved   Diet recommendation: Heart healthy, low carb  Filed Weights   10/09/15 2248 10/10/15 0500 10/12/15 0410  Weight: 115.2 kg (253 lb 15.5 oz) 115.7 kg (255 lb 1.2 oz) 117.1 kg (258 lb 2.5 oz)    History of present illness:  64 yo Male with PMH of CHF presented to the hospital with complaints of abdominal pain and diarrhea. He was noted to be hypotensive and dehydrated. He was found to have acute diverticulitis and admitted for further management.   Hospital Course:  Acute sigmoid diverticulitis revealed on CT abdomen pelvis. There was no evidence of abscess or free air on the CT of the abdomen and pelvis. Was initially started on IV Cipro and Flagyl, will discharge with oral abx . WBC WNL. Abdominal pain resolved and no nausea and vomiting.  1. Hypotension, resolved. Currently normotensive. Etiology likely volume depletion coupled with antihypertensive medications; less likely sepsis associated hypotension since he was not febrile, tachycardic, and his lactic acid is within normal limits. He was briefly started on phenylephrine infusion which was weaned off.  Blood pressure has remained stable since then. 2. Chronic systolic CHF. EF 20-25% ECHO as below. Findings are likely chronic. No evidence of volume overload. Will restart on outpatient dose of Torsemide and beta-blocker. Will hold off on restarting ACE-I due to renal dysfunction, until follow-up with PCP. 3. CAD with history of CABG. No complaints of chest pain at this time. 4. Chronic atrial fibrillation and history of pacemaker. Patient is on carvedilol for rate control and Coumadin for anticoagulation as outpatient. Patient's INR is supratherapeutic at 3.24. Will resume Coumadin in two days. . 5. DM Type 2. Will continue home regimen. Hgb A1C 7.7. 6. AKI. Likely secondary to hypoperfusion and prerenal azotemia. Torsemide and lisinopril were held on admission. Renal function has improved with hydration.Will restart Torsemide but hold off on starting Lisinopril until follow-up with PCP.   7. Normocytic anemia. Possibly related to infection and dilutional effects of IV fluids. No evidence of active bleeding.  TSH WNL and anemia panel indicated possible chronic disease. Hgb is stable.  8. Thrombocytopenia. Possibly related to infection and dilutional effects of IV fluids. Improving  Procedures:  ECHO Study Conclusions  - Left ventricle: Wall thickness was increased in a pattern of severe LVH. Systolic function was severely reduced. The estimated ejection fraction was in the range of 20% to 25%. Diffuse hypokinesis. - Mitral valve: There was severe regurgitation. - Left atrium: The atrium was severely dilated. - Right ventricle: The cavity size was severely dilated. Systolic function was moderately to severely reduced. - Right atrium: The atrium was severely dilated. - Tricuspid valve: There was severe regurgitation. - Pulmonary arteries: Systolic pressure  Consultations:   Discharge Exam: Filed Vitals:   10/12/15 0842  BP:   Pulse:   Temp: 96.8 F (36 C)  Resp:       General: NAD, looks comfortable  Cardiovascular: RRR, S1, S2   Respiratory: clear bilaterally, No wheezing, rales or rhonchi  Abdomen: soft, non tender, no distention , bowel sounds normal  Musculoskeletal: No edema b/l  Discharge Instructions   Discharge Instructions    Diet - low sodium heart healthy    Complete by:  As directed      Increase activity slowly    Complete by:  As directed           Current Discharge Medication List    START taking these medications   Details  ciprofloxacin (CIPRO) 500 MG tablet Take 1 tablet (500 mg total) by mouth 2 (two) times daily. Qty: 20 tablet, Refills: 0    metroNIDAZOLE (FLAGYL) 500 MG tablet Take 1 tablet (500 mg total) by mouth 3 (three) times daily. Qty: 30 tablet, Refills: 0      CONTINUE these medications which have CHANGED   Details  warfarin (COUMADIN) 10 MG tablet Take 1 tablet (10 mg total) by mouth daily. Restart in 2 days      CONTINUE these medications which have NOT CHANGED   Details  allopurinol (ZYLOPRIM) 100 MG tablet Take 100 mg by mouth daily.    carvedilol (COREG) 12.5 MG tablet Take 12.5 mg by mouth daily.     Fluticasone Furoate-Vilanterol (BREO ELLIPTA) 100-25 MCG/INH AEPB Inhale 1 puff into the lungs daily.     glyBURIDE (DIABETA) 5 MG tablet Take 5 mg by mouth daily with breakfast.    HUMALOG MIX 75/25 KWIKPEN (75-25) 100 UNIT/ML Kwikpen Inject 20-30 Units into the skin 2 (two) times daily. 30 units in the morning and 20 units at night.    HYDROcodone-acetaminophen (NORCO) 7.5-325 MG tablet Take 1 tablet by mouth every 6 (six) hours as needed for moderate pain.    linagliptin (TRADJENTA) 5 MG TABS tablet Take 5 mg by mouth daily.    mexiletine (MEXITIL) 150 MG capsule Take 150 mg by mouth 3 (three) times daily.    Omega-3 Fatty Acids (FISH OIL) 1000 MG CAPS Take 1 capsule by mouth daily.     potassium chloride SA (K-DUR,KLOR-CON) 20 MEQ tablet Take 20 mEq by mouth 2 (two) times daily.     simvastatin (ZOCOR) 20 MG tablet Take 20 mg by mouth daily.    tamsulosin (FLOMAX) 0.4 MG CAPS capsule Take 0.4 mg by mouth daily after supper.     tiotropium (SPIRIVA) 18 MCG inhalation capsule Place 18 mcg into inhaler and inhale daily.    torsemide (DEMADEX) 20 MG tablet Take 20 mg by mouth daily.    vitamin B-12 (CYANOCOBALAMIN) 100 MCG tablet Take 100 mcg by mouth daily.      STOP taking these medications     lisinopril (PRINIVIL,ZESTRIL) 2.5 MG tablet      oxyCODONE-acetaminophen (PERCOCET) 5-325 MG per tablet        No Known Allergies Follow-up Information    Follow up with [facility] EMERGENCY DEPARTMENT.   Specialty:  Emergency Medicine   Why:  As needed, If symptoms worsen or for INR check   Contact information:   [address] [id number] [place] [address] [phone]      Follow up with See PCP resource guide In 2 days.   Why:  For INR check  The results of significant diagnostics from this hospitalization (including imaging, microbiology, ancillary and laboratory) are listed below for reference.    Significant Diagnostic Studies: Ct Abdomen Pelvis Wo Contrast  10/09/2015  CLINICAL DATA:  Lower abdominal pain for 2 days, COPD, coronary artery disease, diabetes mellitus EXAM: CT ABDOMEN AND PELVIS WITHOUT CONTRAST TECHNIQUE: Multidetector CT imaging of the abdomen and pelvis was performed following the standard protocol without IV contrast. Sagittal and coronal MPR images reconstructed from axial data set. Patient drank dilute oral contrast for exam. IV contrast not utilized due to renal dysfunction, creatinine 2.86. COMPARISON:  None FINDINGS: Lung bases clear. Post median sternotomy with scattered atherosclerotic calcifications. Pacemaker leads RIGHT atrium, RIGHT ventricle and coronary sinus. Lung bases clear. Gallbladder surgically absent. 1.5 x 1.3 cm diameter exophytic nodule posterior LEFT kidney, low-attenuation consistent  with cyst. Liver, spleen, pancreas, kidneys, and adrenal glands otherwise normal. Scattered atherosclerotic calcifications without aortic aneurysm. Normal appendix. Diverticulosis of sigmoid colon with sigmoid wall thickening and pericolic inflammatory changes of the sigmoid mesocolon consistent with acute diverticulitis. No evidence of abscess or free intraperitoneal air. Stomach and remaining bowel loops unremarkable. No mass, adenopathy, or free fluid. Probable small BILATERAL inguinal hernias containing fat. Bladder, ureters and prostate gland grossly unremarkable. No acute osseous findings. IMPRESSION: Sigmoid diverticulitis. Small LEFT renal cyst. Tiny umbilical and probable small BILATERAL inguinal hernias containing fat. Electronically Signed   By: Ulyses Southward M.D.   On: 10/09/2015 18:08    Microbiology: Recent Results (from the past 240 hour(s))  MRSA PCR Screening     Status: Abnormal   Collection Time: 10/09/15 11:55 PM  Result Value Ref Range Status   MRSA by PCR POSITIVE (A) NEGATIVE Final    Comment:        The GeneXpert MRSA Assay (FDA approved for NASAL specimens only), is one component of a comprehensive MRSA colonization surveillance program. It is not intended to diagnose MRSA infection nor to guide or monitor treatment for MRSA infections. CRITICAL RESULT CALLED TO, READ BACK BY AND VERIFIED WITH: LEE, B AT 0618 ON 10/10/2015 BY WOODS, M   C difficile quick scan w PCR reflex     Status: None   Collection Time: 10/11/15  5:53 AM  Result Value Ref Range Status   C Diff antigen NEGATIVE NEGATIVE Final   C Diff toxin NEGATIVE NEGATIVE Final   C Diff interpretation Negative for toxigenic C. difficile  Final     Labs: Basic Metabolic Panel:  Recent Labs Lab 10/09/15 1435 10/10/15 0444 10/11/15 0436 10/12/15 0508  NA 134* 134* 135 137  K 4.2 3.5 4.2 4.0  CL 97* 103 107 106  CO2 25 22 20* 23  GLUCOSE 139* 95 170* 179*  BUN 93* 85* 53* 35*  CREATININE 2.68*  2.23* 1.56* 1.53*  CALCIUM 9.0 7.8* 8.6* 8.8*   Liver Function Tests:  Recent Labs Lab 10/09/15 1435 10/10/15 0444  AST 21 17  ALT 14* 11*  ALKPHOS 56 46  BILITOT 1.4* 1.6*  PROT 7.8 6.3*  ALBUMIN 4.1 3.3*   No results for input(s): LIPASE, AMYLASE in the last 168 hours. No results for input(s): AMMONIA in the last 168 hours. CBC:  Recent Labs Lab 10/09/15 1435 10/10/15 0444 10/11/15 0436 10/12/15 0508  WBC 13.4* 8.8 10.6* 8.9  NEUTROABS 8.8*  --   --   --   HGB 12.2* 10.5* 11.5* 11.1*  HCT 35.9* 31.1* 34.4* 33.3*  MCV 94.2 94.2 93.7 95.4  PLT 129* 111* 131*  114*   CBG:  Recent Labs Lab 10/11/15 0755 10/11/15 1131 10/11/15 1619 10/11/15 2013 10/12/15 0744  GLUCAP 183* 236* 171* 294* 180*       Signed:  Erick Blinks, MD  [facility] 10/12/2015, 10:42 AM    By signing my name below, I, Zadie Cleverly, attest that this documentation has been prepared under the direction and in the presence of Erick Blinks, MD. Electronically signed: Zadie Cleverly, Scribe. 10/12/2015 9:27 AM  I, Dr. Erick Blinks, personally performed the services described in this documentaiton. All medical record entries made by the scribe were at my direction and in my presence. I have reviewed the chart and agree that the record reflects my personal performance and is accurate and complete  Erick Blinks, MD, 10/12/2015 10:42 AM

## 2015-10-12 NOTE — Care Management Note (Signed)
Case Management Note  Patient Details  Name: Stanley Castillo MRN: [id number] Date of Birth: 1951/10/15    Expected Discharge Date:  10/12/15               Expected Discharge Plan:  Home/Self Care  In-House Referral:  NA  Discharge planning Services  CM Consult  Post Acute Care Choice:  NA Choice offered to:  NA  DME Arranged:    DME Agency:     HH Arranged:    HH Agency:     Status of Service:  Completed, signed off  Medicare Important Message Given:  Yes-second notification given Date Medicare IM Given:    Medicare IM give by:    Date Additional Medicare IM Given:    Additional Medicare Important Message give by:     If discussed at Long Length of Stay Meetings, dates discussed:    Additional Comments: Pt discharged home today with self care. No CM needs.  Malcolm Metro, RN 10/12/2015, 12:50 PM

## 2015-11-13 IMAGING — CT CT ABD-PELV W/O CM
2 of 4 series · 17 of 46 positions shown, 19 images · non-contrast
Comparison: None

CLINICAL DATA: Lower abdominal pain for 2 days, COPD, coronary
artery disease, diabetes mellitus

EXAM:
CT ABDOMEN AND PELVIS WITHOUT CONTRAST
TECHNIQUE: Multidetector CT imaging of the abdomen and pelvis was performed
following the standard protocol without IV contrast. Sagittal and
coronal MPR images reconstructed from axial data set. Patient drank
dilute oral contrast for exam. IV contrast not utilized due to renal
dysfunction, creatinine 2.86.

[Series 2: abdomen/pelvis w/o contrast · axial · non-contrast · 0.95mm/px · z∈[-514,-60]mm · 14 of 105 slices shown, 16 images]
[im 7/105  soft-tissue]
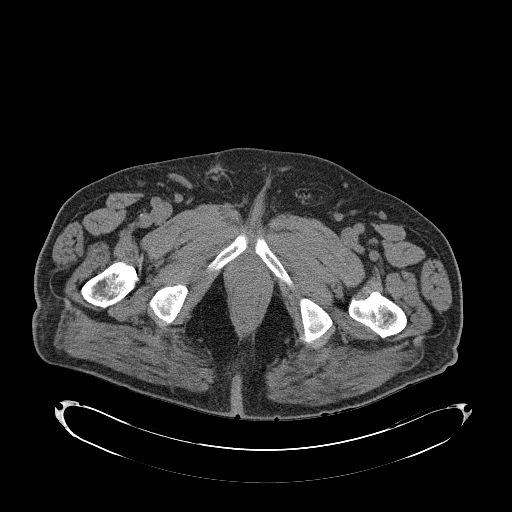
[im 7/105  bone]
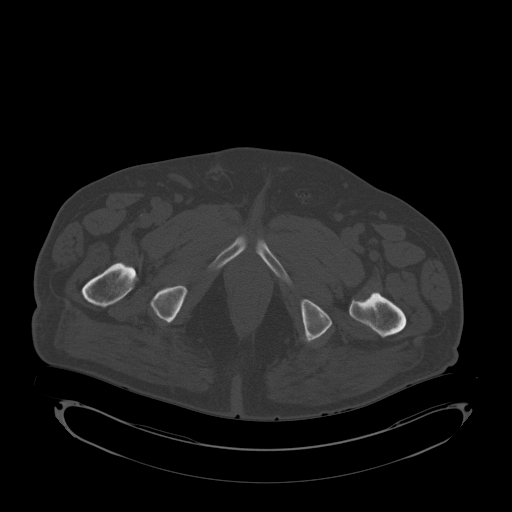
[im 14/105  soft-tissue]
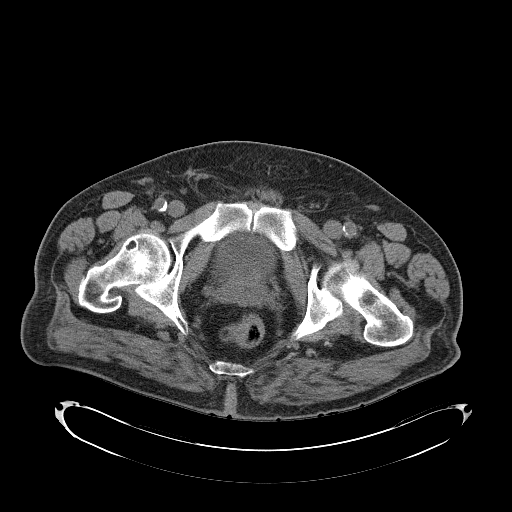
[im 21/105  soft-tissue]
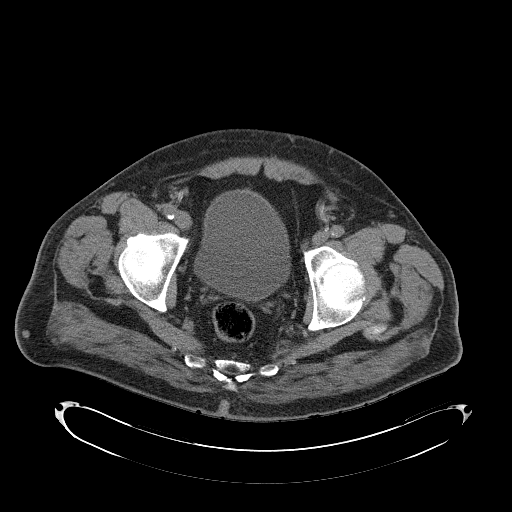
[im 28/105  soft-tissue]
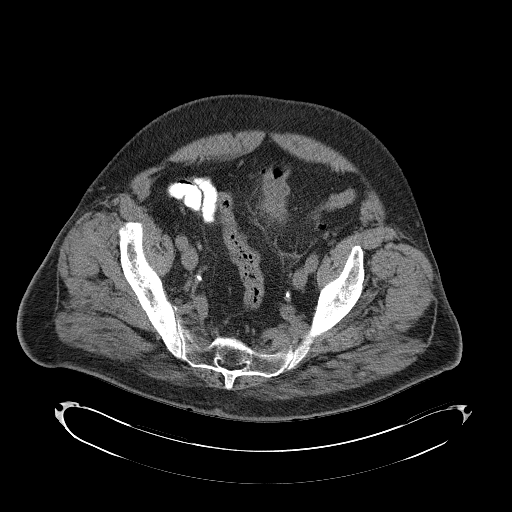
[im 35/105  soft-tissue]
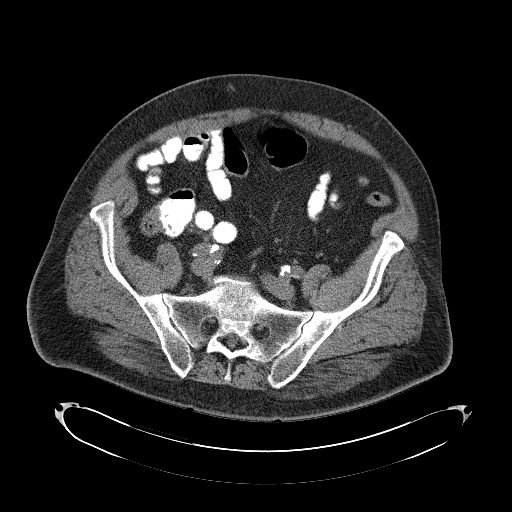
[im 42/105  soft-tissue]
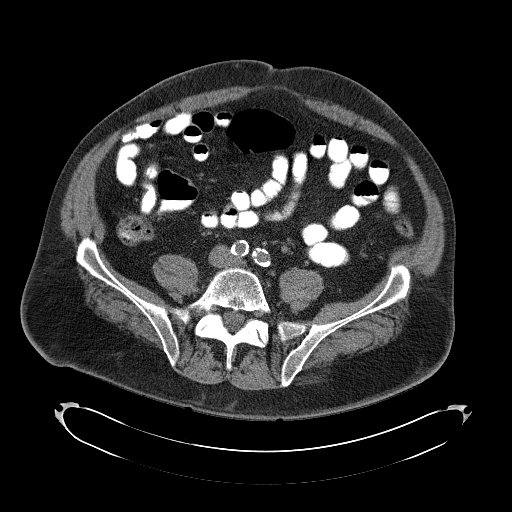
[im 49/105  soft-tissue]
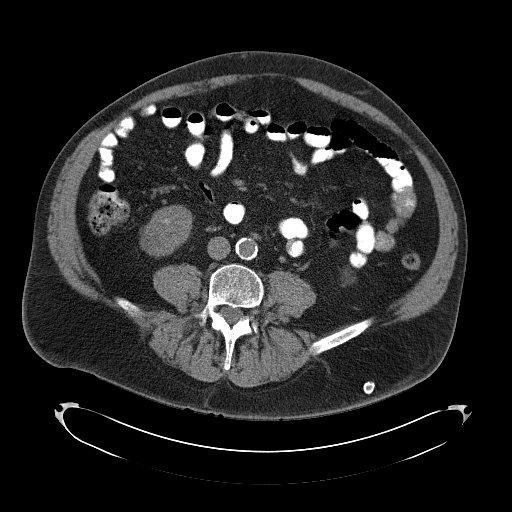
[im 56/105  soft-tissue]
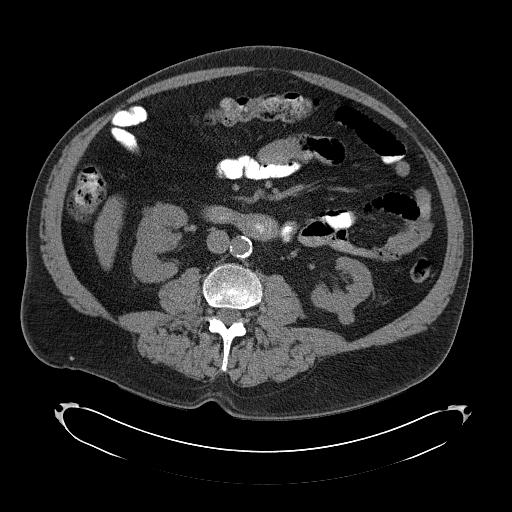
[im 63/105  soft-tissue]
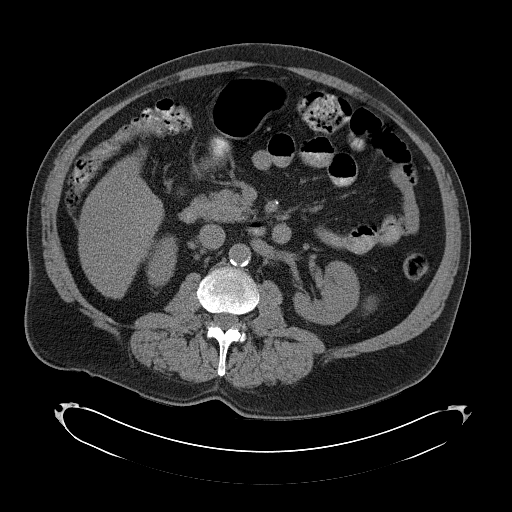
[im 63/105  bone]
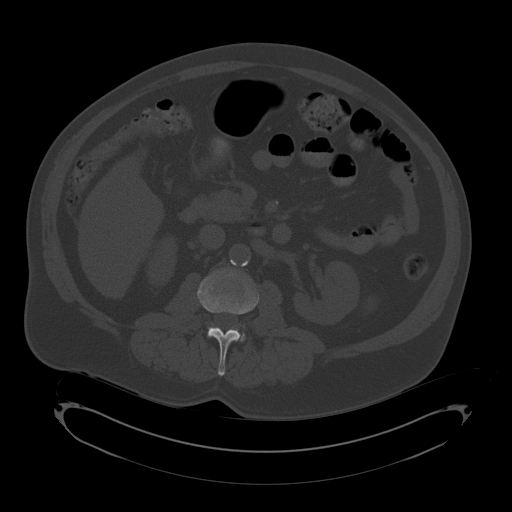
[im 70/105  soft-tissue]
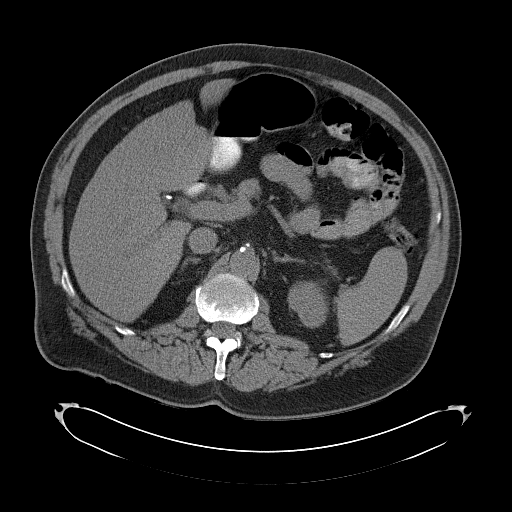
[im 77/105  soft-tissue]
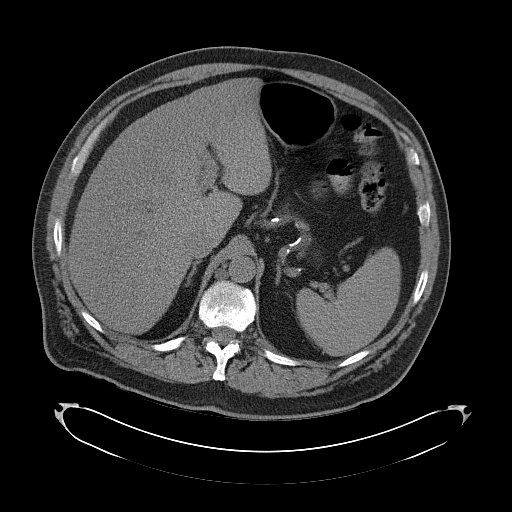
[im 84/105  soft-tissue]
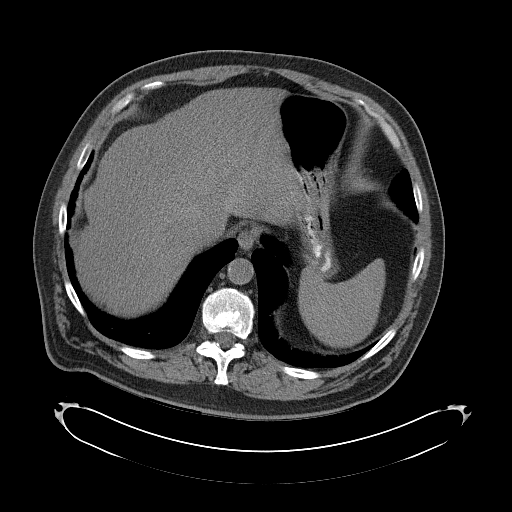
[im 91/105  soft-tissue]
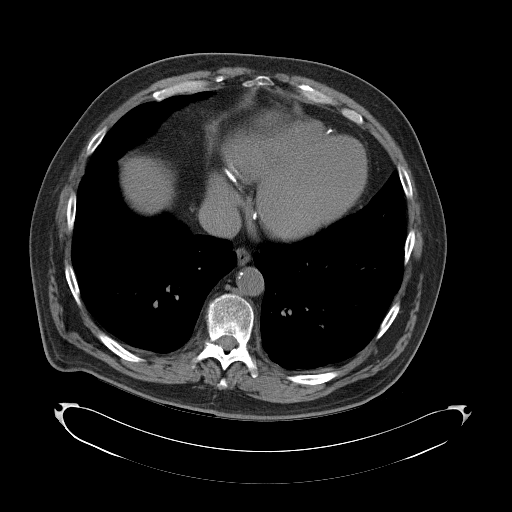
[im 98/105  soft-tissue]
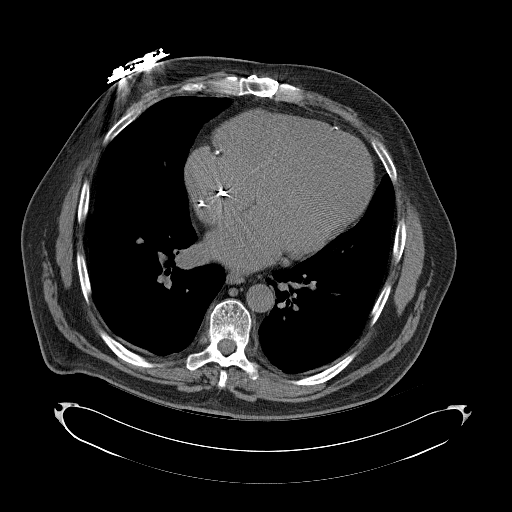

[Series 3: mpr cor 3.0mm · coronal · 0.95mm/px · 3 of 133 slices shown]
[im 45/133  soft-tissue]
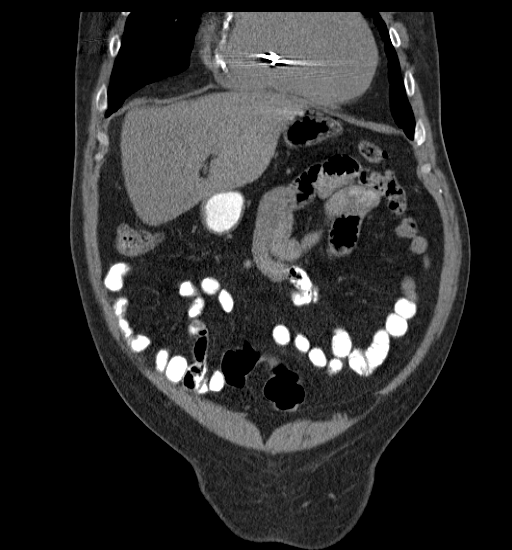
[im 59/133  soft-tissue]
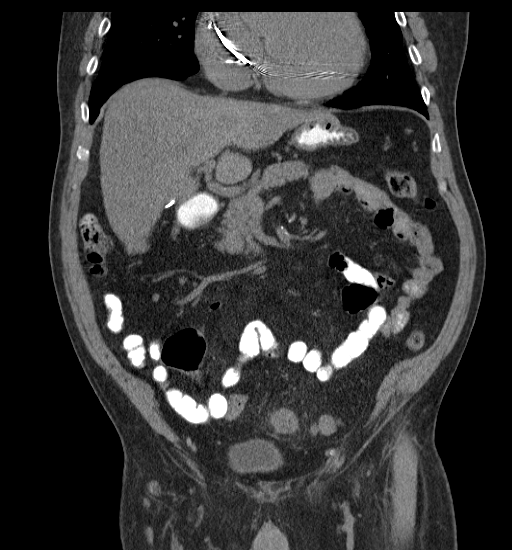
[im 74/133  soft-tissue]
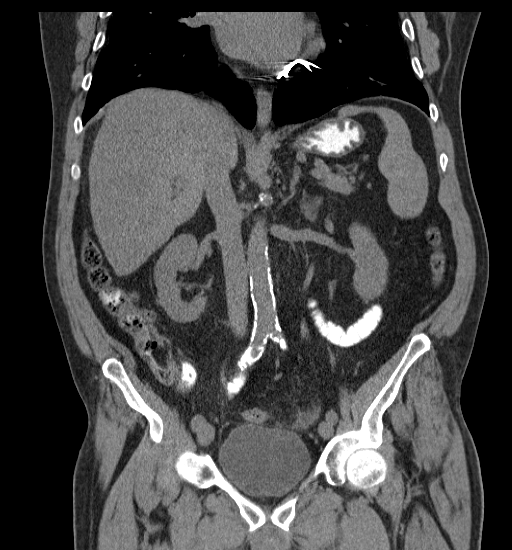

[17 of 46 positions shown; findings below may reference images not displayed]

FINDINGS: Lung bases clear.

Post median sternotomy with scattered atherosclerotic
calcifications.

Pacemaker leads RIGHT atrium, RIGHT ventricle and coronary sinus.

Lung bases clear.

Gallbladder surgically absent.

1.5 x 1.3 cm diameter exophytic nodule posterior LEFT kidney,
low-attenuation consistent with cyst.

Liver, spleen, pancreas, kidneys, and adrenal glands otherwise
normal.

Scattered atherosclerotic calcifications without aortic aneurysm.

Normal appendix.

Diverticulosis of sigmoid colon with sigmoid wall thickening and
pericolic inflammatory changes of the sigmoid mesocolon consistent
with acute diverticulitis.

No evidence of abscess or free intraperitoneal air.

Stomach and remaining bowel loops unremarkable.

No mass, adenopathy, or free fluid.

Probable small BILATERAL inguinal hernias containing fat.

Bladder, ureters and prostate gland grossly unremarkable.

No acute osseous findings.
IMPRESSION: Sigmoid diverticulitis.

Small LEFT renal cyst.

Tiny umbilical and probable small BILATERAL inguinal hernias
containing fat.

## 2016-09-17 DEATH — deceased
